# Patient Record
Sex: Male | Born: 1950 | Race: White | Hispanic: No | Marital: Single | Smoking: Former smoker
Health system: Southern US, Community
[De-identification: ages and names within clinical notes are randomized; demographics above are authoritative.]

## PROBLEM LIST (undated history)

## (undated) ENCOUNTER — Emergency Department (HOSPITAL_COMMUNITY): Discharge status: Absent without leave | Payer: Self-pay

## (undated) DIAGNOSIS — S065X9A Traumatic subdural hemorrhage with loss of consciousness of unspecified duration, initial encounter: Secondary | ICD-10-CM

## (undated) DIAGNOSIS — I10 Essential (primary) hypertension: Secondary | ICD-10-CM

## (undated) DIAGNOSIS — I639 Cerebral infarction, unspecified: Secondary | ICD-10-CM

## (undated) DIAGNOSIS — M1990 Unspecified osteoarthritis, unspecified site: Secondary | ICD-10-CM

## (undated) DIAGNOSIS — M069 Rheumatoid arthritis, unspecified: Secondary | ICD-10-CM

## (undated) DIAGNOSIS — G40909 Epilepsy, unspecified, not intractable, without status epilepticus: Secondary | ICD-10-CM

## (undated) DIAGNOSIS — S065XAA Traumatic subdural hemorrhage with loss of consciousness status unknown, initial encounter: Secondary | ICD-10-CM

## (undated) DIAGNOSIS — I6200 Nontraumatic subdural hemorrhage, unspecified: Secondary | ICD-10-CM

## (undated) HISTORY — PX: SUBDURAL HEMATOMA EVACUATION VIA CRANIOTOMY: SUR319

## (undated) HISTORY — DX: Nontraumatic subdural hemorrhage, unspecified: I62.00

## (undated) HISTORY — PX: CEREBRAL ANEURYSM REPAIR: SHX164

## (undated) HISTORY — PX: APPENDECTOMY: SHX54

---

## 1998-02-21 ENCOUNTER — Encounter: Admission: RE | Admit: 1998-02-21 | Discharge: 1998-02-21 | Payer: Self-pay | Admitting: Family Medicine

## 1998-03-22 ENCOUNTER — Encounter: Admission: RE | Admit: 1998-03-22 | Discharge: 1998-03-22 | Payer: Self-pay | Admitting: Family Medicine

## 1998-05-15 ENCOUNTER — Encounter: Admission: RE | Admit: 1998-05-15 | Discharge: 1998-05-15 | Payer: Self-pay | Admitting: Sports Medicine

## 1998-07-05 ENCOUNTER — Encounter: Admission: RE | Admit: 1998-07-05 | Discharge: 1998-07-05 | Payer: Self-pay | Admitting: Sports Medicine

## 1998-07-26 ENCOUNTER — Encounter: Admission: RE | Admit: 1998-07-26 | Discharge: 1998-07-26 | Payer: Self-pay | Admitting: Family Medicine

## 1999-01-14 ENCOUNTER — Encounter: Admission: RE | Admit: 1999-01-14 | Discharge: 1999-01-14 | Payer: Self-pay | Admitting: Family Medicine

## 1999-02-28 ENCOUNTER — Encounter: Admission: RE | Admit: 1999-02-28 | Discharge: 1999-02-28 | Payer: Self-pay | Admitting: Sports Medicine

## 1999-06-06 ENCOUNTER — Encounter: Admission: RE | Admit: 1999-06-06 | Discharge: 1999-06-06 | Payer: Self-pay | Admitting: Sports Medicine

## 2000-01-30 ENCOUNTER — Encounter: Admission: RE | Admit: 2000-01-30 | Discharge: 2000-01-30 | Payer: Self-pay | Admitting: Family Medicine

## 2000-02-13 ENCOUNTER — Encounter: Admission: RE | Admit: 2000-02-13 | Discharge: 2000-02-13 | Payer: Self-pay | Admitting: Sports Medicine

## 2000-02-26 ENCOUNTER — Encounter: Admission: RE | Admit: 2000-02-26 | Discharge: 2000-02-26 | Payer: Self-pay | Admitting: Sports Medicine

## 2000-06-03 ENCOUNTER — Encounter: Admission: RE | Admit: 2000-06-03 | Discharge: 2000-06-03 | Payer: Self-pay | Admitting: Family Medicine

## 2000-08-20 ENCOUNTER — Encounter: Admission: RE | Admit: 2000-08-20 | Discharge: 2000-08-20 | Payer: Self-pay | Admitting: Family Medicine

## 2000-12-31 ENCOUNTER — Encounter: Admission: RE | Admit: 2000-12-31 | Discharge: 2000-12-31 | Payer: Self-pay | Admitting: Sports Medicine

## 2001-04-01 ENCOUNTER — Encounter: Admission: RE | Admit: 2001-04-01 | Discharge: 2001-04-01 | Payer: Self-pay | Admitting: Sports Medicine

## 2001-12-23 ENCOUNTER — Encounter: Payer: Self-pay | Admitting: Otolaryngology

## 2001-12-23 ENCOUNTER — Encounter: Admission: RE | Admit: 2001-12-23 | Discharge: 2001-12-23 | Payer: Self-pay | Admitting: Otolaryngology

## 2002-01-26 ENCOUNTER — Ambulatory Visit (HOSPITAL_COMMUNITY): Admission: RE | Admit: 2002-01-26 | Discharge: 2002-01-26 | Payer: Self-pay | Admitting: Family Medicine

## 2002-01-26 ENCOUNTER — Encounter: Admission: RE | Admit: 2002-01-26 | Discharge: 2002-01-26 | Payer: Self-pay | Admitting: Family Medicine

## 2002-04-28 ENCOUNTER — Encounter: Admission: RE | Admit: 2002-04-28 | Discharge: 2002-04-28 | Payer: Self-pay | Admitting: Sports Medicine

## 2002-11-03 ENCOUNTER — Encounter: Admission: RE | Admit: 2002-11-03 | Discharge: 2002-11-03 | Payer: Self-pay | Admitting: Sports Medicine

## 2003-03-16 ENCOUNTER — Encounter: Admission: RE | Admit: 2003-03-16 | Discharge: 2003-03-16 | Payer: Self-pay | Admitting: Sports Medicine

## 2003-07-04 ENCOUNTER — Encounter: Admission: RE | Admit: 2003-07-04 | Discharge: 2003-08-02 | Payer: Self-pay | Admitting: Neurosurgery

## 2003-08-24 ENCOUNTER — Encounter: Admission: RE | Admit: 2003-08-24 | Discharge: 2003-08-24 | Payer: Self-pay | Admitting: Sports Medicine

## 2003-11-10 ENCOUNTER — Ambulatory Visit (HOSPITAL_COMMUNITY): Admission: RE | Admit: 2003-11-10 | Discharge: 2003-11-10 | Payer: Self-pay | Admitting: Gastroenterology

## 2004-01-25 ENCOUNTER — Ambulatory Visit: Payer: Self-pay

## 2004-03-14 ENCOUNTER — Ambulatory Visit: Payer: Self-pay | Admitting: Sports Medicine

## 2004-03-28 ENCOUNTER — Ambulatory Visit: Payer: Self-pay | Admitting: Sports Medicine

## 2004-05-30 ENCOUNTER — Ambulatory Visit: Payer: Self-pay | Admitting: Sports Medicine

## 2004-07-25 ENCOUNTER — Ambulatory Visit: Payer: Self-pay | Admitting: Sports Medicine

## 2004-08-29 ENCOUNTER — Ambulatory Visit: Payer: Self-pay | Admitting: Sports Medicine

## 2004-11-07 ENCOUNTER — Ambulatory Visit: Payer: Self-pay | Admitting: Sports Medicine

## 2005-03-06 ENCOUNTER — Ambulatory Visit: Payer: Self-pay | Admitting: Sports Medicine

## 2005-05-07 ENCOUNTER — Encounter
Admission: RE | Admit: 2005-05-07 | Discharge: 2005-08-05 | Payer: Self-pay | Admitting: Physical Medicine & Rehabilitation

## 2005-05-07 ENCOUNTER — Ambulatory Visit: Payer: Self-pay | Admitting: Physical Medicine & Rehabilitation

## 2005-06-12 ENCOUNTER — Ambulatory Visit: Payer: Self-pay | Admitting: Sports Medicine

## 2005-06-17 ENCOUNTER — Ambulatory Visit: Payer: Self-pay | Admitting: Sports Medicine

## 2005-10-09 ENCOUNTER — Ambulatory Visit: Payer: Self-pay | Admitting: Sports Medicine

## 2005-12-04 ENCOUNTER — Ambulatory Visit (HOSPITAL_COMMUNITY): Admission: RE | Admit: 2005-12-04 | Discharge: 2005-12-04 | Payer: Self-pay | Admitting: Family Medicine

## 2005-12-04 ENCOUNTER — Ambulatory Visit: Payer: Self-pay | Admitting: Sports Medicine

## 2005-12-17 ENCOUNTER — Ambulatory Visit: Payer: Self-pay | Admitting: Family Medicine

## 2006-02-12 ENCOUNTER — Ambulatory Visit: Payer: Self-pay | Admitting: Sports Medicine

## 2006-03-12 ENCOUNTER — Ambulatory Visit: Payer: Self-pay | Admitting: Family Medicine

## 2006-07-23 DIAGNOSIS — M959 Acquired deformity of musculoskeletal system, unspecified: Secondary | ICD-10-CM | POA: Insufficient documentation

## 2006-07-23 DIAGNOSIS — N3941 Urge incontinence: Secondary | ICD-10-CM

## 2006-07-23 DIAGNOSIS — M069 Rheumatoid arthritis, unspecified: Secondary | ICD-10-CM | POA: Insufficient documentation

## 2006-07-23 DIAGNOSIS — R569 Unspecified convulsions: Secondary | ICD-10-CM

## 2006-07-23 DIAGNOSIS — IMO0001 Reserved for inherently not codable concepts without codable children: Secondary | ICD-10-CM

## 2006-07-23 DIAGNOSIS — I10 Essential (primary) hypertension: Secondary | ICD-10-CM

## 2006-08-06 ENCOUNTER — Ambulatory Visit: Payer: Self-pay | Admitting: Sports Medicine

## 2006-08-06 DIAGNOSIS — E785 Hyperlipidemia, unspecified: Secondary | ICD-10-CM

## 2006-08-07 ENCOUNTER — Encounter: Payer: Self-pay | Admitting: *Deleted

## 2006-08-13 ENCOUNTER — Encounter: Payer: Self-pay | Admitting: Sports Medicine

## 2006-08-26 ENCOUNTER — Encounter: Payer: Self-pay | Admitting: Sports Medicine

## 2006-12-10 ENCOUNTER — Encounter: Payer: Self-pay | Admitting: Sports Medicine

## 2007-01-13 ENCOUNTER — Encounter: Payer: Self-pay | Admitting: Sports Medicine

## 2007-01-21 ENCOUNTER — Encounter: Payer: Self-pay | Admitting: Sports Medicine

## 2007-02-15 ENCOUNTER — Encounter: Payer: Self-pay | Admitting: Sports Medicine

## 2007-03-30 ENCOUNTER — Encounter: Payer: Self-pay | Admitting: Sports Medicine

## 2007-04-02 ENCOUNTER — Encounter: Payer: Self-pay | Admitting: Sports Medicine

## 2007-04-08 ENCOUNTER — Encounter: Payer: Self-pay | Admitting: Sports Medicine

## 2007-05-26 ENCOUNTER — Encounter: Payer: Self-pay | Admitting: Sports Medicine

## 2007-06-13 ENCOUNTER — Encounter (INDEPENDENT_AMBULATORY_CARE_PROVIDER_SITE_OTHER): Payer: Self-pay | Admitting: General Surgery

## 2007-06-13 ENCOUNTER — Inpatient Hospital Stay (HOSPITAL_COMMUNITY): Admission: EM | Admit: 2007-06-13 | Discharge: 2007-06-13 | Payer: Self-pay | Admitting: Emergency Medicine

## 2007-06-17 ENCOUNTER — Ambulatory Visit: Payer: Self-pay | Admitting: Sports Medicine

## 2007-06-17 LAB — CONVERTED CEMR LAB
Cholesterol: 215 mg/dL — ABNORMAL HIGH (ref 0–200)
HDL: 41 mg/dL (ref >39)
Triglycerides: 145 mg/dL (ref <150)
VLDL: 29 mg/dL (ref 0–40)

## 2007-06-21 ENCOUNTER — Telehealth: Payer: Self-pay | Admitting: *Deleted

## 2007-08-05 ENCOUNTER — Encounter: Payer: Self-pay | Admitting: Sports Medicine

## 2007-09-30 ENCOUNTER — Encounter: Payer: Self-pay | Admitting: Sports Medicine

## 2008-01-20 ENCOUNTER — Ambulatory Visit: Payer: Self-pay | Admitting: Sports Medicine

## 2008-03-13 ENCOUNTER — Encounter: Payer: Self-pay | Admitting: *Deleted

## 2008-04-07 ENCOUNTER — Ambulatory Visit: Payer: Self-pay | Admitting: Family Medicine

## 2008-04-07 LAB — CONVERTED CEMR LAB
HDL: 82 mg/dL (ref >39)
Total CHOL/HDL Ratio: 3.1
VLDL: 15 mg/dL (ref 0–40)

## 2008-04-10 ENCOUNTER — Encounter: Payer: Self-pay | Admitting: Family Medicine

## 2008-04-18 ENCOUNTER — Telehealth: Payer: Self-pay | Admitting: Family Medicine

## 2008-05-12 ENCOUNTER — Encounter: Payer: Self-pay | Admitting: Family Medicine

## 2008-07-11 ENCOUNTER — Ambulatory Visit: Payer: Self-pay | Admitting: Family Medicine

## 2008-07-12 ENCOUNTER — Ambulatory Visit (HOSPITAL_COMMUNITY): Admission: RE | Admit: 2008-07-12 | Discharge: 2008-07-12 | Payer: Self-pay | Admitting: Family Medicine

## 2008-07-12 LAB — CONVERTED CEMR LAB
ALT: 64 units/L — ABNORMAL HIGH (ref 0–53)
Alkaline Phosphatase: 45 units/L (ref 39–117)
BUN: 15 mg/dL (ref 6–23)
Calcium: 9.2 mg/dL (ref 8.4–10.5)
Creatinine, Ser: 0.41 mg/dL (ref 0.40–1.50)
Glucose, Bld: 92 mg/dL (ref 70–99)
Potassium: 4.2 meq/L (ref 3.5–5.3)
Total Bilirubin: 0.4 mg/dL (ref 0.3–1.2)
Total Protein: 7.3 g/dL (ref 6.0–8.3)

## 2008-08-21 ENCOUNTER — Emergency Department (HOSPITAL_COMMUNITY): Admission: EM | Admit: 2008-08-21 | Discharge: 2008-08-21 | Payer: Self-pay | Admitting: Emergency Medicine

## 2008-08-24 ENCOUNTER — Ambulatory Visit: Payer: Self-pay | Admitting: Family Medicine

## 2008-08-24 DIAGNOSIS — IMO0002 Reserved for concepts with insufficient information to code with codable children: Secondary | ICD-10-CM

## 2008-08-25 ENCOUNTER — Encounter: Admission: RE | Admit: 2008-08-25 | Discharge: 2008-08-25 | Payer: Self-pay | Admitting: Family Medicine

## 2008-08-25 LAB — CONVERTED CEMR LAB
Alkaline Phosphatase: 52 units/L (ref 39–117)
CO2: 25 meq/L (ref 19–32)
Creatinine, Ser: 0.4 mg/dL (ref 0.40–1.50)
Glucose, Bld: 93 mg/dL (ref 70–99)
Sodium: 141 meq/L (ref 135–145)
Total Bilirubin: 0.5 mg/dL (ref 0.3–1.2)
Vit D, 25-Hydroxy: 6 ng/mL — ABNORMAL LOW (ref 30–89)

## 2008-09-01 ENCOUNTER — Encounter: Payer: Self-pay | Admitting: Family Medicine

## 2008-09-01 ENCOUNTER — Encounter: Admission: RE | Admit: 2008-09-01 | Discharge: 2008-10-05 | Payer: Self-pay | Admitting: Family Medicine

## 2008-10-31 ENCOUNTER — Ambulatory Visit: Payer: Self-pay | Admitting: Family Medicine

## 2008-11-02 ENCOUNTER — Encounter: Payer: Self-pay | Admitting: Family Medicine

## 2008-11-07 ENCOUNTER — Encounter: Payer: Self-pay | Admitting: Family Medicine

## 2008-12-06 ENCOUNTER — Encounter: Payer: Self-pay | Admitting: Family Medicine

## 2008-12-07 ENCOUNTER — Encounter: Payer: Self-pay | Admitting: Family Medicine

## 2008-12-13 ENCOUNTER — Encounter: Payer: Self-pay | Admitting: Family Medicine

## 2009-01-17 ENCOUNTER — Encounter: Payer: Self-pay | Admitting: Family Medicine

## 2009-01-19 ENCOUNTER — Ambulatory Visit: Payer: Self-pay | Admitting: Family Medicine

## 2009-01-25 ENCOUNTER — Telehealth: Payer: Self-pay | Admitting: Family Medicine

## 2009-01-25 ENCOUNTER — Encounter: Payer: Self-pay | Admitting: Family Medicine

## 2009-03-21 ENCOUNTER — Encounter: Admission: RE | Admit: 2009-03-21 | Discharge: 2009-03-21 | Payer: Self-pay | Admitting: Neurosurgery

## 2009-04-04 ENCOUNTER — Encounter: Payer: Self-pay | Admitting: Family Medicine

## 2009-06-29 ENCOUNTER — Encounter: Admission: RE | Admit: 2009-06-29 | Discharge: 2009-06-29 | Payer: Self-pay | Admitting: Neurosurgery

## 2009-08-30 ENCOUNTER — Encounter: Payer: Self-pay | Admitting: Family Medicine

## 2009-10-10 ENCOUNTER — Encounter: Payer: Self-pay | Admitting: Family Medicine

## 2009-10-23 ENCOUNTER — Encounter: Payer: Self-pay | Admitting: Family Medicine

## 2009-11-09 ENCOUNTER — Encounter: Payer: Self-pay | Admitting: Family Medicine

## 2009-12-12 ENCOUNTER — Telehealth: Payer: Self-pay | Admitting: Family Medicine

## 2009-12-14 ENCOUNTER — Ambulatory Visit: Payer: Self-pay | Admitting: Family Medicine

## 2009-12-14 DIAGNOSIS — M3320 Polymyositis, organ involvement unspecified: Secondary | ICD-10-CM

## 2009-12-17 ENCOUNTER — Encounter: Payer: Self-pay | Admitting: Family Medicine

## 2009-12-18 ENCOUNTER — Encounter: Admission: RE | Admit: 2009-12-18 | Discharge: 2009-12-18 | Payer: Self-pay | Admitting: Rheumatology

## 2009-12-20 ENCOUNTER — Telehealth (INDEPENDENT_AMBULATORY_CARE_PROVIDER_SITE_OTHER): Payer: Self-pay | Admitting: *Deleted

## 2009-12-20 ENCOUNTER — Encounter: Payer: Self-pay | Admitting: Family Medicine

## 2010-02-06 ENCOUNTER — Encounter: Payer: Self-pay | Admitting: Family Medicine

## 2010-03-28 ENCOUNTER — Encounter: Payer: Self-pay | Admitting: Family Medicine

## 2010-05-29 ENCOUNTER — Ambulatory Visit: Admit: 2010-05-29 | Payer: Self-pay

## 2010-06-05 ENCOUNTER — Encounter: Payer: Self-pay | Admitting: Family Medicine

## 2010-06-16 ENCOUNTER — Encounter: Payer: Self-pay | Admitting: Rheumatology

## 2010-06-27 NOTE — Consult Note (Signed)
Summary: [facility]   Imported By: Clydell Hakim 12/27/2009 11:17:44  _____________________________________________________________________  External Attachment:    Type:   Image     Comment:   External Document

## 2010-06-27 NOTE — Miscellaneous (Signed)
  Clinical Lists Changes  Problems: Removed problem of OTHER SPECIFIED DISORDERS OF LOWER LEG JOINT (ICD-719.86) Removed problem of SPECIAL SCREENING MALIGNANT NEOPLASM OF PROSTATE (ICD-V76.44) Removed problem of ECZEMA, ATOPIC DERMATITIS (ICD-691.8) Removed problem of URINARY INCONTINENCE (ICD-788.30) Removed problem of ALLERGIC RHINITIS (ICD-477.9) Removed problem of RHEUMATOID ARTHRITIS (ICD-714.0) Removed problem of CEREBROVASCULAR ACCIDENT, HX OF (ICD-V12.50) Removed problem of SEIZURE DISORDER (ICD-780.39) Removed problem of RHINITIS, ALLERGIC (ICD-477.9) Removed problem of Status post  LAPS SURG APPENDEC ([id number]) Removed problem of HYPERTENSION (ICD-401.9) Removed problem of LOSS OF WEIGHT (ICD-783.21) Removed problem of OTHER OSTEOPOROSIS (ICD-733.09) Removed problem of INJURY OTHER&UNSPECIFIED KNEE LEG ANKLE&FOOT (ICD-959.7)

## 2010-06-27 NOTE — Consult Note (Signed)
Summary: Dr. Kellie Simmering  Dr. Kellie Simmering   Imported By: Bradly Bienenstock 11/17/2009 13:29:39  _____________________________________________________________________  External Attachment:    Type:   Image     Comment:   External Document

## 2010-06-27 NOTE — Consult Note (Signed)
Summary: [facility]   Imported By: De Nurse 10/12/2009 14:41:07  _____________________________________________________________________  External Attachment:    Type:   Image     Comment:   External Document

## 2010-06-27 NOTE — Progress Notes (Signed)
Summary: Ref Req  Phone Note Call from Patient Call back at Home Phone [phone]   Caller: Patient Summary of Call: Pt says that Dr. Kellie Simmering wants him to have some tests on his legs, but pt not sure what he needs and is asking that we call Dr. Ines Bloomer office and find out what he needs.  Pt says that if we ask for Crystal she knows what he needs. Initial call taken by: Clydell Hakim,  December 12, 2009 2:43 PM  Follow-up for Phone Call        told him per [facility]'s notes, he may get a muscle bx. he states he was told to call us. told him it has been almost a yr & will need to be seen by md here. appt fri with pcp at 1:30 Follow-up by: Golden Circle RN,  December 12, 2009 4:52 PM    I will discuss with him at next visit.  Paula Compton MD  December 12, 2009 5:24 PM

## 2010-06-27 NOTE — Consult Note (Signed)
Summary: [facility]  R[facility]umatology   Imported By: Clydell Hakim 09/06/2009 09:02:26  _____________________________________________________________________  External Attachment:    Type:   Image     Comment:   External Document

## 2010-06-27 NOTE — Miscellaneous (Signed)
Summary: needs orders  Clinical Lists Changes spoke with [facility] from Dr. Ines Bloomer office [phone]).  she is asking that the pcp make a refrral for Social work & home care aid. he cannot even get up from a chair due to his extreme muscle weakness. the office bought him a lift seat.  he lives with his elderly mom. phone has been turned off. has no transportation. they are concerned about his nutritional status & supports. they have already spoken to him about [facility]. asks that pcp initiate getting him help. to pcp...Marland KitchenMarland KitchenGolden Circle RN  December 20, 2009 10:45 AM   Orders: Added new Referral order of Social Work Referral (Social ) - Signed    Home Health eval ordered at last office visit, after we called Dr. Ines Bloomer office.  Will order social work consult as well.  Paula Compton MD  December 20, 2009 11:49 AM   spoke with DDS and was told that since patient has a home health consult, if nurse  deems necessary she will make the Social Worker referral.  called [facility] and was told that they have a Child psychotherapist thru their agency and  Child psychotherapist will be scheduled to see patient also.  Patient is to be evaluated by home health today . RN will call [facility] office tomorrow to make sure all this gets done [phone] [place] office.) Theresia Lo RN  December 20, 2009 1:41 PM   Noted.  Thanks for the updated information.  Paula Compton MD  December 20, 2009 3:11 PM

## 2010-06-27 NOTE — Progress Notes (Signed)
Summary: phn msg  Phone Note From Other Clinic Call back at [phone]   Caller: [facility]- [facility] Summary of Call: calling to put in a referral request for PT & OT - has fallen twice w/in last 2 weeks - very weak not really eating - wants to request for an appetite stimulant wants to verify meds is taking: Methatrexate 2.5mg  - 5 tabs Sat & Sun only Oxybuten 5mg  daily Phenobarb 60.8mg  1 bedtime takes occasional tylenol  fax to [phone] Initial call taken by: De Nurse,  December 20, 2009 3:07 PM    PT/OT referrals done.  Please call back with med list (updated last visit).   Paula Compton MD  December 20, 2009 3:13 PM Referrals and med list faxed to [phone] Starleen Blue RN 12/21/2009

## 2010-06-27 NOTE — Letter (Signed)
Summary: Generic Letter  [facility]  [address]   [place], [place] [address]   Phone: [phone]  Fax: [phone]    12/17/2009  Chaim Hehir [address] [place], [place]  [address]  Dear Mr. Wurster,   It was a pleasure to see you last week.  I write with the results of your LDL ("bad") cholesterol, which is mildly high at 109 (our goal is less than 100; ideally less than 70).  As we discussed, your cholesterol medicine is being held while your muscle problem is being worked up.  We may wish to consider other treatments for the cholesterol.  Please call with any questions.        Sincerely,   Paula Compton MD  Appended Document: Generic Letter mailed

## 2010-06-27 NOTE — Assessment & Plan Note (Signed)
Summary: wants tests on legs/[place]   Vital Signs:  Patient profile:   60 year old male Height:      67 inches Weight:      116 pounds BMI:     18.23 Temp:     100.1 degrees F oral Pulse rate:   148 / minute BP sitting:   132 / 94  (left arm) Cuff size:   regular  Vitals Entered By: Loralee Pacas CMA (December 14, 2009 1:39 PM) CC: f/u arthritis in leg Is Patient Diabetic? No   CC:  f/u arthritis in leg.  History of Present Illness: [name] [name] comes in today accompanied by his nephew.  He was seen in mid-June by Dr Kellie Simmering, given prednisone burst which he says did not help his symptoms of weakness.  Has fallen twice at home since then.  Dr Kellie Simmering is ordering an MRI lower exts to work up his weakness, also considering muscle biopsy.  Has been seen by Dr Anne Hahn of neurology, EMG performed.   Diagnosis of polymyositis is the working diagnosis.  Has been told to stop his statin med, but he has not done so.  Ran out of phenobarbital and plans to pick up today.     Habits & Providers  Alcohol-Tobacco-Diet     Tobacco Status: never  Current Medications (verified): 1)  Ditropan Xl 5 Mg Tb24 (Oxybutynin Chloride) .... Take 2 Tablets By Mouth Once A Day 2)  Hydrochlorothiazide 25 Mg Tabs (Hydrochlorothiazide) .Marland Kitchen.. 1 By Mouth Once Daily 3)  Methotrexate 2.5 Mg Tabs (Methotrexate Sodium) .... 5 By Mouth On Saturday and Sunday 4)  Phenobarbital 64.8 Mg Tabs (Phenobarbital) .Marland Kitchen.. 1 By Mouth Qhs 5)  Hydroxychloroquine Sulfate 200 Mg Tabs (Hydroxychloroquine Sulfate) .... 2 By Mouth Qam 6)  Orthotic Shoes .... Sig: Patient For Custom Orthotic Shoes Indication: S/p Cva With Balance Problems; L Foot Bunion Surgery. 7)  Vitamin D 1000 Unit Tabs (Cholecalciferol) .... Sig: Take 1 Tab By Mouth Once Daily 8)  Voltaren 1 % Gel (Diclofenac Sodium) .... Sig: Apply To Affected Area (Knees) Topically, Once or Twice Daily Disp 100gm Tube  Allergies (verified): No Known Drug Allergies  Social History: Smoking  Status:  never  Physical Exam  General:  Generally well; no apparent distress. Does not sit down for fear he will not be able to get out of chair. Msk:  standing in the exam room. Wasting of legs, L worse than R.    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 He is seeing both Dr. Kellie Simmering (Rheum) and Dr. Anne Hahn (Neuro) for this.  He has stopped the simvastatin, out of concern that this may be aggravating his symptoms.  He has not noticed much change since doing this.  He is to go for MRI on July 26th, at [facility].  There is some discussion wiht Dr. Kellie Simmering regarding possibly having a muscule biopsy.   To continue on MTX and Plaquenil as per Dr Kellie Simmering.  He is off prednisone now, as recent trial of prednisone did not seem to help with strength.  MRI Bilat thighs. Home health referral for home health eval.  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  To check direct LDL today, as  his last one was awhile ago (and was <100).    Orders: Home Health Referral (Home Health) [facility]- [facility]  3 [phone])  Complete Medication List: 1)  Ditropan Xl 5 Mg Tb24 (Oxybutynin chloride) .... Take 2 tablets by mouth once a day 2)  Hydrochlorothiazide 25 Mg Tabs (Hydrochlorothiazide) .Marland Kitchen.. 1 by mouth once daily 3)  Methotrexate 2.5 Mg Tabs (Methotrexate sodium) .... 5 by mouth on saturday and sunday 4)  Phenobarbital 64.8 Mg Tabs (Phenobarbital) .Marland Kitchen.. 1 by mouth qhs 5)  Hydroxychloroquine Sulfate 200 Mg Tabs (Hydroxychloroquine sulfate) .... 2 by mouth qam 6)  Orthotic Shoes  .... Sig: patient for custom orthotic shoes indication: s/p cva with balance problems; l foot bunion surgery. 7)  Vitamin D 1000 Unit Tabs  (Cholecalciferol) .... Sig: take 1 tab by mouth once daily 8)  Voltaren 1 % Gel (Diclofenac sodium) .... Sig: apply to affected area (knees) topically, once or twice daily disp 100gm tube  Other Orders: Direct LDL-[facility] ([id number])  Patient Instructions: 1)  It was good to see you today.  The MRI ordered by Dr. Kellie Simmering for the 26th of July is of the lower legs, to evaluate for your weakness. 2)  I am ordering a home health evaluation to see what we can do to help you at home.  3)  I am checking your LDL ("bad") cholesterol today as well.  Stay off the cholesterol medication (simvastatin) for now.  4)  I would like to see how things are going in another 2 months.

## 2010-06-27 NOTE — Consult Note (Signed)
Summary: Rheumatology  Rheumatology   Imported By: Clydell Hakim 02/14/2010 14:56:04  _____________________________________________________________________  External Attachment:    Type:   Image     Comment:   External Document

## 2010-06-27 NOTE — Consult Note (Signed)
Summary: [name] [name]- Rheumatology  [name] [name]- Rheumatology   Imported By: De Nurse 06/10/2010 15:49:32  _____________________________________________________________________  External Attachment:    Type:   Image     Comment:   External Document

## 2010-07-16 ENCOUNTER — Ambulatory Visit: Payer: Self-pay | Admitting: Family Medicine

## 2010-07-20 ENCOUNTER — Other Ambulatory Visit: Payer: Self-pay | Admitting: Family Medicine

## 2010-07-21 NOTE — Telephone Encounter (Signed)
Refill request

## 2010-07-30 ENCOUNTER — Ambulatory Visit (INDEPENDENT_AMBULATORY_CARE_PROVIDER_SITE_OTHER): Payer: Medicare Other | Admitting: Family Medicine

## 2010-07-30 ENCOUNTER — Encounter: Payer: Self-pay | Admitting: Family Medicine

## 2010-07-30 DIAGNOSIS — M3320 Polymyositis, organ involvement unspecified: Secondary | ICD-10-CM

## 2010-07-30 DIAGNOSIS — E785 Hyperlipidemia, unspecified: Secondary | ICD-10-CM

## 2010-07-30 DIAGNOSIS — L259 Unspecified contact dermatitis, unspecified cause: Secondary | ICD-10-CM

## 2010-07-30 DIAGNOSIS — M069 Rheumatoid arthritis, unspecified: Secondary | ICD-10-CM

## 2010-07-30 DIAGNOSIS — L309 Dermatitis, unspecified: Secondary | ICD-10-CM

## 2010-07-30 LAB — CONVERTED CEMR LAB
ALT: 30 units/L (ref 0–53)
Albumin: 4.4 g/dL (ref 3.5–5.2)
Alkaline Phosphatase: 59 units/L (ref 39–117)
Glucose, Bld: 86 mg/dL (ref 70–99)
MCV: 91.8 fL (ref 78.0–100.0)
Platelets: 230 10*3/uL (ref 150–400)
RDW: 14.7 % (ref 11.5–15.5)
Total Protein: 7.1 g/dL (ref 6.0–8.3)
WBC: 4.4 10*3/uL (ref 4.0–10.5)

## 2010-07-30 LAB — COMPREHENSIVE METABOLIC PANEL
Alkaline Phosphatase: 59 U/L (ref 39–117)
Glucose, Bld: 86 mg/dL (ref 70–99)
Potassium: 4.2 mEq/L (ref 3.5–5.3)
Sodium: 137 mEq/L (ref 135–145)
Total Protein: 7.1 g/dL (ref 6.0–8.3)

## 2010-07-30 LAB — CBC
HCT: 46.1 % (ref 39.0–52.0)
Hemoglobin: 15.5 g/dL (ref 13.0–17.0)
MCH: 30.9 pg (ref 26.0–34.0)
RBC: 5.02 MIL/uL (ref 4.22–5.81)
RDW: 14.7 % (ref 11.5–15.5)
WBC: 4.4 10*3/uL (ref 4.0–10.5)

## 2010-07-30 MED ORDER — HYDROCORTISONE 2.5 % EX CREA: TOPICAL_CREAM | CUTANEOUS | Status: AC

## 2010-07-30 MED ORDER — OXYBUTYNIN CHLORIDE ER 5 MG PO TB24: 5.0000 mg | ORAL_TABLET | Freq: Every day | ORAL | Status: DC

## 2010-07-30 NOTE — Progress Notes (Signed)
  Subjective:    Patient ID: [name] [name], male    DOB: 1950/11/16, 60 y.o.   MRN: [id number]  HPI [name] [name] is here today with his nephew.  He reports that he has had no more falls. Had seen Dr Kellie Simmering when he had a RA flare, had been restarted on his HCQ and is doing better.  Ran out of oxybutynin and is having more trouble controlling bladder.   Reviewed meds.  Has appt with new neurologist for his seizure disorder, controlled on phenobarbital.    Has had a rash on his back, which is getting better.  Also on his arms (mostly antecubital areas) which has been bothering him. Believes onset associated with use of [employer] brand baby oil.  Has never had with other moisturizing products.    Still needs help to get up from sitting position.  Is walking with 4-pronged walker.  Home health services which we had arranged came for a month and then signed off.   Review of Systems Denies dyspnea, chest pain or falls.     Objective:   Physical Exam  Constitutional:       Standing during entire visit.  No apparent distress.   HENT:       Dry mucus membranes, clear oropharynx  Neck: Normal range of motion. Neck supple.  Cardiovascular: Normal rate and regular rhythm.   Pulmonary/Chest: Effort normal and breath sounds normal. No respiratory distress. He has no wheezes. He has no rales.  Skin:       Mild erythema over bilat antecubital areas, with some excoriation.   Over mid-back, has desquamation with hyperpigmented skin, no erythema.           Assessment & Plan:

## 2010-07-30 NOTE — Assessment & Plan Note (Addendum)
Dermatitis across antecubitals, entire back.  Appears to be exfoliating on back; arms still with mild erythema, appears consistent with atopic derm (he reports possible association with using [employer] brand baby lotion).  To avoid possible trigger; mild potency HC cream for 7-10 days.  Also consider possible effects of HCQ). Checking CPK and CMet today. Will forward results and note to Dr Kellie Simmering once they are back.

## 2010-07-30 NOTE — Patient Instructions (Signed)
It was a pleasure to see you today.  I am sending your labs for the CPK requested by Dr Kellie Simmering, as well as liver and kidney functions, and a Complete Blood Count.   I am glad you are going to see the eye doctor (Dr. Dione Booze) next month.   I sent a prescription for the bladder medicine and a steroid cream for your arms and back.  Please do not use the cream for more than 10 days in a row.  I would like to see you again in another 6 months.

## 2010-07-30 NOTE — Assessment & Plan Note (Signed)
Previously on statin and well controlled; has trended up since statin discontinued, which was done in response to his myositis.  Will recheck d-LDL today.

## 2010-07-30 NOTE — Assessment & Plan Note (Signed)
Continues on MTX and HCQ prescribed by Dr Kellie Simmering. Has eye examwith Dr Dione Booze in April 2012.

## 2010-07-30 NOTE — Assessment & Plan Note (Addendum)
Pt last seen by Dr Kellie Simmering on Jun 05, 2010.  For CPK today, to forward result to Dr Kellie Simmering.

## 2010-07-31 LAB — LDL CHOLESTEROL, DIRECT: Direct LDL: 151 mg/dL — ABNORMAL HIGH

## 2010-08-01 ENCOUNTER — Encounter: Payer: Self-pay | Admitting: Family Medicine

## 2010-10-08 NOTE — H&P (Signed)
NAME:  [name], [name]                   ACCOUNT NO.:  [id number]   MEDICAL RECORD NO.:  [id number]          PATIENT TYPE:  INP   LOCATION:  5120                         FACILITY:  [facility]   PHYSICIAN:  Gabrielle Dare. Janee Morn, M.D.DATE OF BIRTH:  26-May-1951   DATE OF ADMISSION:  06/12/2007  DATE OF DISCHARGE:                              HISTORY & PHYSICAL   CHIEF COMPLAINT:  Right lower quadrant abdominal pain.   HISTORY OF PRESENT ILLNESS:  [name] is a 60 year old white gentleman  who developed lower abdominal pain Saturday morning, persisted through  the day Saturday, and gradually localized towards his right lower  quadrant.  He came to the [facility] emergency department for further  evaluation.  Workup here includes white blood cell count of 18,800.  CT  scan of the abdomen and pelvis was obtained which shows acute  appendicitis without evidence of abscess.  We are asked to evaluate for  further treatment.  He continues to have pain in the right lower  quadrant.  He denies nausea.   PAST MEDICAL HISTORY:  Rheumatoid arthritis and seizure disorder,  cerebral aneurysm and traumatic brain injury.   PAST SURGICAL HISTORY:  Craniotomy x2.  The first one was for aneurysm  and the next one was for a blood clot after a fall.  The second one was  done by Dr. Trey Sailors.   SOCIAL HISTORY:  He is disabled.  He does not smoke.   ALLERGIES:  No known drug allergies.   MEDICATIONS:  1. Phenobarbital 64.8 mg p.o. q.h.s.  2. Methotrexate 7.5 mg p.o. every Saturday and every Sunday.   REVIEW OF SYSTEMS:  Significant for the GI system as above.  MUSCULOSKELETAL:  Chronic rheumatoid arthritis.  CARDIAC:  Negative.  PULMONARY:  Negative.  GU:  Negative.  NEURO:  Chronic seizure disorder  and history as above without current complaints.   PHYSICAL EXAMINATION:  Temperature 98.0, pulse 114, respirations 20,  blood pressure 134/68.  GENERAL:  He is awake and alert.  HEENT:  He has very dry skin on  his face.  He has craniotomy scars x2.  NECK:  Supple with no tenderness or masses.  CHEST:  Lungs are clear to auscultation with good respiratory excursion.  No wheezing is heard.  HEART:  Regular and slightly tachycardiac.  No murmurs heard.  Impulse  is palpable in the left chest.  ABDOMEN:  Has right lower quadrant tenderness with voluntary guarding.  Bowel sounds are present.  There is no significant distention.  No  masses or other tenderness is noted.  SKIN:  Dry with no rashes present.  NEUROLOGIC EXAM:  He is alert and oriented.  He moves all extremities to  command, has no gross focal deficits.   LABORATORY STUDIES:  White blood cell count 18.8, hemoglobin 16.4,  platelets 311.  Basic metabolic panel within normal limits except  glucose 147.  CT scan of the abdomen and pelvis shows acute appendicitis  without evidence of perforation or abscess.   IMPRESSION:  Acute appendicitis.   PLAN:  Will take him to the  operating room emergently for a laparoscopic  appendectomy.  Will give him intravenous antibiotics.  Procedure risks  and benefits were discussed in detail with the patient and he is  agreeable.      Gabrielle Dare Janee Morn, M.D.  Electronically Signed     BET/MEDQ  D:  06/13/2007  T:  06/13/2007  Job:  [id number]

## 2010-10-08 NOTE — Discharge Summary (Signed)
NAME:  [name] [name], [name] [name]                   ACCOUNT NO.:  [id number]   MEDICAL RECORD NO.:  [id number]          PATIENT TYPE:  INP   LOCATION:  5120                         FACILITY:  [facility]   PHYSICIAN:  Lennie Muckle, MD      DATE OF BIRTH:  1951-01-26   DATE OF ADMISSION:  06/12/2007  DATE OF DISCHARGE:  06/13/2007                               DISCHARGE SUMMARY   FINAL DIAGNOSIS:  Acute appendicitis.   HOSPITAL COURSE:  [name] [name] is a 60 year old male who came into the  [facility] Emergency Department due to appendicitis.  He was taken to  the operating room, where a laparoscopic appendectomy was performed by  Dr. Violeta Gelinas.  Postoperatively, he had an uneventful course.  He  has been advanced to a regular diet.  Pain has been controlled with oral  agents.   He is instructed to take over-the-counter stool softener in addition to  his Percocet.   Discharged home.   He will call if he develops any issues and is instructed to perform no  heavy lifting greater than 20 pounds.   He will follow up with Dr. Janee Morn in 2-3 weeks.      Lennie Muckle, MD  Electronically Signed     ALA/MEDQ  D:  06/13/2007  T:  06/13/2007  Job:  [id number]

## 2010-10-08 NOTE — Op Note (Signed)
NAME:  [name], [name]                   ACCOUNT NO.:  [id number]   MEDICAL RECORD NO.:  [id number]          PATIENT TYPE:  INP   LOCATION:  5120                         FACILITY:  [facility]   PHYSICIAN:  Gabrielle Dare. Janee Morn, M.D.DATE OF BIRTH:  03/01/1951   DATE OF PROCEDURE:  06/13/2007  DATE OF DISCHARGE:                               OPERATIVE REPORT   PREOPERATIVE DIAGNOSIS:  Acute appendicitis.   POSTOPERATIVE DIAGNOSIS:  Acute appendicitis.   PROCEDURE:  Laparoscopic appendectomy.   SURGEON:  Gabrielle Dare. Janee Morn, M.D.   ANESTHESIA:  General.   HISTORY OF PRESENT ILLNESS:  [name] is a 60 year old gentleman who  came to [facility] emergency department complaining of lower abdominal  pain localizing towards the right lower quadrant.  Workup demonstrated  leukocytosis of 18,800, and CT scan of the abdomen and pelvis were  consistent with acute appendicitis.  He is brought emergently to the  operating room for laparoscopic appendectomy.   PROCEDURE IN DETAIL:  Informed consent was obtained.  The patient  received intravenous antibiotics.  He was identified in the preop  holding area.  He was brought to the operating room.  General  endotracheal anesthesia was administered by the anesthesia staff.  His  abdomen was prepped and draped in a sterile fashion.  The infraumbilical  region was infiltrated with 0.25% Marcaine with epinephrine.   An infraumbilical incision was made.  The subcutaneous tissues were  dissected down, revealing the anterior fascia.  This was divided  sharply.  The peritoneal cavity was entered under direct vision without  difficulty.  A 0 Vicryl pursestring suture was placed around the fascial  opening, and the Hassan trocar was inserted into the abdomen.  The  abdomen was insufflated with carbon dioxide in standard fashion under  direct vision.  A 12-mm left lower quadrant and a 5-mm right upper  quadrant port were placed.  0.25% Marcaine with epinephrine was used  at  all port sites.  Laparoscopic exploration revealed an inflamed but not  perforated appendix.  The appendix was retracted superiorly.  The base  was dissected down.  The mesoappendix was then divided with the Harmonic  scalpel, achieving excellent hemostasis.  The base of the appendix was  then divided with endoscopic GIA stapler with vascular load.  The  appendix was placed in an EndoCatch bag and removed from the abdomen via  the left lower quadrant port site.  The abdomen was copiously irrigated  with 2 liters of saline.  The mesoappendix and staple line were  inspected.  There was no bleeding present.  Further irrigation was done,  and the irrigation fluid was evacuated.  The staple line and  mesoappendix were rechecked and remained completely dry and intact.  The  residual irrigation fluid was evacuated.  The ports were removed under  direct vision.  Pneumoperitoneum was released.  The [facility] trocar was  removed from the abdomen, and the infraumbilical fascia was closed by  tying the 0 Vicryl pursestring suture, with care not to trap any intra-  abdominal contents.  All three wounds were copiously  irrigated.  The  left lower quadrant and infraumbilical skin was closed with running 4-0  Vicryl subcuticular stitch.  Dermabond was placed on all three wounds.  Sponge, needle, and instrument counts were correct.   The patient tolerated the procedure well without apparent complication  and was taken to the recovery in stable condition.      Gabrielle Dare Janee Morn, M.D.  Electronically Signed     BET/MEDQ  D:  06/13/2007  T:  06/13/2007  Job:  [id number]

## 2010-10-11 NOTE — Op Note (Signed)
NAME:  [name] [name], [name] [name]                             ACCOUNT NO.:  [id number]   MEDICAL RECORD NO.:  [id number]                   PATIENT TYPE:  AMB   LOCATION:  ENDO                                 FACILITY:  [facility]   PHYSICIAN:  Anselmo Rod, M.D.               DATE OF BIRTH:  09/30/1950   DATE OF PROCEDURE:  11/10/2003  DATE OF DISCHARGE:                                 OPERATIVE REPORT   PROCEDURE PERFORMED:  Screening colonoscopy.   ENDOSCOPIST:  Charna Elizabeth, M.D.   INSTRUMENT USED:  Olympus video colonoscope.   INDICATIONS FOR PROCEDURE:  The patient is a 60 year old white male with a  family history of colon cancer and a personal history of occasional bright  red blood per rectum, undergoing screening colonoscopy to rule out colonic  polyps, masses, etc.   PREPROCEDURE PREPARATION:  Informed consent was procured from the patient.  The patient was fasted for eight hours prior to the procedure and prepped  with a bottle of magnesium citrate and a gallon of GoLYTELY the night prior  to the procedure.   PREPROCEDURE PHYSICAL:  The patient had stable vital signs.  Neck supple.  Chest clear to auscultation.  S1 and S2 regular.  Abdomen soft with normal  bowel sounds.   DESCRIPTION OF PROCEDURE:  The patient was placed in left lateral decubitus  position and sedated with 40 mg of Demerol and 6 mg of Versed intravenously.  Once the patient was adequately sedated and maintained on low flow oxygen  and continuous cardiac monitoring, the Olympus video colonoscope was  advanced from the rectum to the cecum.  The appendicular orifice and  ileocecal valve were clearly visualized and photographed.  No masses,  polyps, erosions, ulcerations or diverticula were seen.  Small internal  hemorrhoids were appreciated on retroflexion in the rectum.  The patient  tolerated the procedure well without immediate complications.   IMPRESSION:  1. Normal colonoscopy up to the cecum except for small  internal hemorrhoids.  2. No masses, polyps or diverticula were seen.   RECOMMENDATIONS:  1. Continue high fiber diet with liberal fluid intake.  2. Outpatient followup as need arises in the future.  3. Repeat colorectal cancer screening is recommended in the next five years     unless the patient develops any abnormal symptoms in the interim.                                               Anselmo Rod, M.D.    JNM/MEDQ  D:  11/10/2003  T:  11/11/2003  Job:  [id number]   cc:   Sibyl Parr. Darrick Penna, M.D.  Fax: [phone]

## 2010-12-02 ENCOUNTER — Other Ambulatory Visit: Payer: Self-pay | Admitting: Family Medicine

## 2010-12-02 NOTE — Telephone Encounter (Signed)
Refill request

## 2011-01-31 ENCOUNTER — Ambulatory Visit: Payer: Medicare Other | Admitting: Family Medicine

## 2011-02-14 LAB — DIFFERENTIAL
Basophils Absolute: 0.1
Basophils Relative: 0
Eosinophils Absolute: 0
Lymphs Abs: 0.8
Monocytes Absolute: 0.7
Monocytes Relative: 4
Neutro Abs: 17.3 — ABNORMAL HIGH

## 2011-02-14 LAB — COMPREHENSIVE METABOLIC PANEL
Alkaline Phosphatase: 52
BUN: 6
CO2: 27
Calcium: 9.2
Glucose, Bld: 129 — ABNORMAL HIGH
Sodium: 134 — ABNORMAL LOW
Total Bilirubin: 0.5

## 2011-02-14 LAB — CBC
HCT: 48.6
WBC: 18.8 — ABNORMAL HIGH

## 2011-02-14 LAB — URINALYSIS, ROUTINE W REFLEX MICROSCOPIC
Bilirubin Urine: NEGATIVE
Glucose, UA: NEGATIVE
Hgb urine dipstick: NEGATIVE
Protein, ur: NEGATIVE
Specific Gravity, Urine: 1.017

## 2011-02-14 LAB — I-STAT 8, (EC8 V) (CONVERTED LAB)
Chloride: 100
Glucose, Bld: 147 — ABNORMAL HIGH
HCT: 54 — ABNORMAL HIGH
Hemoglobin: 18.4 — ABNORMAL HIGH
Sodium: 133 — ABNORMAL LOW

## 2011-02-14 LAB — POCT I-STAT CREATININE: Operator id: 294521

## 2011-02-14 LAB — OCCULT BLOOD X 1 CARD TO LAB, STOOL: Fecal Occult Bld: NEGATIVE

## 2011-03-06 ENCOUNTER — Telehealth: Payer: Self-pay | Admitting: *Deleted

## 2011-03-06 NOTE — Telephone Encounter (Signed)
[name] [name] calling to let Dr. Mauricio Po know she went out and did a Routine Annual Assessment on [name] [name]. She is very concerned with his weight loss.  At the assessment he weighed 123 lbs with a BMI of 20.47. According to their questionnaire, [name] [name] is considered malnourished.  His B/P was 120/88 in right arm and 120/80 in left arm.  Patient is alert with no acute distress.  [name] [name] has an appointment with Dr. Mauricio Po on Friday 03/07/11 @ 9 am.  [name] [name] just wanted to make Dr. Mauricio Po aware of their concerned with his weight loss before his appointment on Friday.  Ileana Ladd

## 2011-03-07 ENCOUNTER — Ambulatory Visit: Payer: Medicare Other | Admitting: Family Medicine

## 2011-03-07 NOTE — Telephone Encounter (Signed)
Patient is coming in to visit today.  Will assess. [name] [name] O

## 2011-07-25 DIAGNOSIS — I6200 Nontraumatic subdural hemorrhage, unspecified: Secondary | ICD-10-CM

## 2011-07-25 HISTORY — DX: Nontraumatic subdural hemorrhage, unspecified: I62.00

## 2011-08-06 ENCOUNTER — Emergency Department (HOSPITAL_COMMUNITY): Payer: Medicare Other

## 2011-08-06 ENCOUNTER — Inpatient Hospital Stay (HOSPITAL_COMMUNITY)
Admission: EM | Admit: 2011-08-06 | Discharge: 2011-08-11 | DRG: 086 | Disposition: A | Discharge status: Skilled Nursing Facility | Payer: Medicare Other | Attending: Family Medicine | Admitting: Family Medicine

## 2011-08-06 ENCOUNTER — Other Ambulatory Visit: Payer: Self-pay

## 2011-08-06 ENCOUNTER — Encounter (HOSPITAL_COMMUNITY): Payer: Self-pay | Admitting: Emergency Medicine

## 2011-08-06 DIAGNOSIS — Y998 Other external cause status: Secondary | ICD-10-CM

## 2011-08-06 DIAGNOSIS — R000 Tachycardia, unspecified: Secondary | ICD-10-CM | POA: Diagnosis present

## 2011-08-06 DIAGNOSIS — R5383 Other fatigue: Secondary | ICD-10-CM | POA: Diagnosis present

## 2011-08-06 DIAGNOSIS — R5381 Other malaise: Secondary | ICD-10-CM | POA: Diagnosis present

## 2011-08-06 DIAGNOSIS — E559 Vitamin D deficiency, unspecified: Secondary | ICD-10-CM | POA: Diagnosis present

## 2011-08-06 DIAGNOSIS — R269 Unspecified abnormalities of gait and mobility: Secondary | ICD-10-CM

## 2011-08-06 DIAGNOSIS — M959 Acquired deformity of musculoskeletal system, unspecified: Secondary | ICD-10-CM | POA: Diagnosis present

## 2011-08-06 DIAGNOSIS — S065X9A Traumatic subdural hemorrhage with loss of consciousness of unspecified duration, initial encounter: Secondary | ICD-10-CM

## 2011-08-06 DIAGNOSIS — I10 Essential (primary) hypertension: Secondary | ICD-10-CM | POA: Diagnosis present

## 2011-08-06 DIAGNOSIS — I69998 Other sequelae following unspecified cerebrovascular disease: Secondary | ICD-10-CM

## 2011-08-06 DIAGNOSIS — S065X0A Traumatic subdural hemorrhage without loss of consciousness, initial encounter: Principal | ICD-10-CM | POA: Diagnosis present

## 2011-08-06 DIAGNOSIS — K5900 Constipation, unspecified: Secondary | ICD-10-CM | POA: Diagnosis present

## 2011-08-06 DIAGNOSIS — M069 Rheumatoid arthritis, unspecified: Secondary | ICD-10-CM | POA: Diagnosis present

## 2011-08-06 DIAGNOSIS — I6990 Unspecified sequelae of unspecified cerebrovascular disease: Secondary | ICD-10-CM

## 2011-08-06 DIAGNOSIS — E785 Hyperlipidemia, unspecified: Secondary | ICD-10-CM | POA: Diagnosis present

## 2011-08-06 DIAGNOSIS — G40909 Epilepsy, unspecified, not intractable, without status epilepticus: Secondary | ICD-10-CM | POA: Diagnosis present

## 2011-08-06 DIAGNOSIS — W19XXXA Unspecified fall, initial encounter: Secondary | ICD-10-CM | POA: Diagnosis present

## 2011-08-06 DIAGNOSIS — E46 Unspecified protein-calorie malnutrition: Secondary | ICD-10-CM | POA: Diagnosis present

## 2011-08-06 DIAGNOSIS — M3320 Polymyositis, organ involvement unspecified: Secondary | ICD-10-CM | POA: Diagnosis present

## 2011-08-06 DIAGNOSIS — Z87891 Personal history of nicotine dependence: Secondary | ICD-10-CM

## 2011-08-06 DIAGNOSIS — Z681 Body mass index (BMI) 19 or less, adult: Secondary | ICD-10-CM

## 2011-08-06 DIAGNOSIS — IMO0001 Reserved for inherently not codable concepts without codable children: Secondary | ICD-10-CM | POA: Diagnosis present

## 2011-08-06 DIAGNOSIS — I498 Other specified cardiac arrhythmias: Secondary | ICD-10-CM | POA: Diagnosis present

## 2011-08-06 HISTORY — DX: Epilepsy, unspecified, not intractable, without status epilepticus: G40.909

## 2011-08-06 HISTORY — DX: Cerebral infarction, unspecified: I63.9

## 2011-08-06 HISTORY — DX: Rheumatoid arthritis, unspecified: M06.9

## 2011-08-06 HISTORY — DX: Traumatic subdural hemorrhage with loss of consciousness status unknown, initial encounter: S06.5XAA

## 2011-08-06 HISTORY — DX: Essential (primary) hypertension: I10

## 2011-08-06 HISTORY — DX: Unspecified osteoarthritis, unspecified site: M19.90

## 2011-08-06 HISTORY — DX: Traumatic subdural hemorrhage with loss of consciousness of unspecified duration, initial encounter: S06.5X9A

## 2011-08-06 LAB — CARDIAC PANEL(CRET KIN+CKTOT+MB+TROPI)
CK, MB: 15.5 ng/mL (ref 0.3–4.0)
Total CK: 341 U/L — ABNORMAL HIGH (ref 7–232)

## 2011-08-06 LAB — COMPREHENSIVE METABOLIC PANEL
Albumin: 4 g/dL (ref 3.5–5.2)
Alkaline Phosphatase: 98 U/L (ref 39–117)
BUN: 7 mg/dL (ref 6–23)
CO2: 32 mEq/L (ref 19–32)
Chloride: 101 mEq/L (ref 96–112)
GFR calc non Af Amer: >90 mL/min (ref >90)
Glucose, Bld: 93 mg/dL (ref 70–99)
Potassium: 4.8 mEq/L (ref 3.5–5.1)
Total Bilirubin: 0.5 mg/dL (ref 0.3–1.2)

## 2011-08-06 LAB — DIFFERENTIAL
Basophils Relative: 1 % (ref 0–1)
Lymphocytes Relative: 13 % (ref 12–46)
Lymphs Abs: 1 10*3/uL (ref 0.7–4.0)
Monocytes Relative: 9 % (ref 3–12)
Neutro Abs: 5.8 10*3/uL (ref 1.7–7.7)
Neutrophils Relative %: 78 % — ABNORMAL HIGH (ref 43–77)

## 2011-08-06 LAB — POCT I-STAT TROPONIN I: Troponin i, poc: 0.43 ng/mL (ref 0.00–0.08)

## 2011-08-06 LAB — URINALYSIS, ROUTINE W REFLEX MICROSCOPIC
Glucose, UA: NEGATIVE mg/dL
Leukocytes, UA: NEGATIVE
Nitrite: NEGATIVE
Specific Gravity, Urine: 1.021 (ref 1.005–1.030)
pH: 6.5 (ref 5.0–8.0)

## 2011-08-06 LAB — CBC
HCT: 44.3 % (ref 39.0–52.0)
Hemoglobin: 15.4 g/dL (ref 13.0–17.0)
RBC: 4.62 MIL/uL (ref 4.22–5.81)

## 2011-08-06 LAB — PHENOBARBITAL LEVEL: Phenobarbital: 11.4 ug/mL — ABNORMAL LOW (ref 15.0–40.0)

## 2011-08-06 LAB — URINE MICROSCOPIC-ADD ON

## 2011-08-06 MED ORDER — SODIUM CHLORIDE 0.9 % IV BOLUS (SEPSIS)
500.0000 mL | Freq: Once | INTRAVENOUS | Status: AC
  Administered 2011-08-06: 1000 mL via INTRAVENOUS

## 2011-08-06 NOTE — ED Notes (Signed)
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

## 2011-08-06 NOTE — Consult Note (Signed)
I have reviewed the CT scan on [name] [name]. This fluid collection  Is causing no mass effect. The basal cisterns are widely patent. This is not an operative mass at this time. Unless his physical exam changes I will see the patient on 3/14. Contact me sooner if he does experience a change.

## 2011-08-06 NOTE — ED Notes (Signed)
Pt presents in w/c, " my knees are locke up" unable to stand to transfer to bed. Pt total care assisting into bed. Pt states he fell twice today, has small abrasions on back of head, top of head and left knee, denies injury other than can not walk. Nephew states he could walk yesterday but unable to today. Pt has history of arthritis in his joints. Pt also smells of cat urine and clothes have urine and stool stains.

## 2011-08-06 NOTE — ED Provider Notes (Signed)
History     CSN: [id number]  Arrival date & time 08/06/11  1416   First MD Initiated Contact with Patient 08/06/11 1816      Chief Complaint  Patient presents with  . Weakness    (Consider location/radiation/quality/duration/timing/severity/associated sxs/prior treatment) The history is provided by the patient.   patient states she's had generalized weakness for last couple years. He states it is worse over last few days. He states he has trouble walking now because of it. No chest pain. No nausea vomiting diarrhea. No fevers. He states his legs came out and he fell forward. 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 No dysuria. No cough.  Past Medical History  Diagnosis Date  . Hypertension   . Arthritis   . Stroke     No past surgical history on file.  No family history on file.  History  Substance Use Topics  . Smoking status: Former Games developer  . Smokeless tobacco: Not on file  . Alcohol Use: Not on file      Review of Systems  Constitutional: Negative for activity change and appetite change.  HENT: Negative for neck stiffness.   Eyes: Negative for pain.  Respiratory: Negative for chest tightness and shortness of breath.   Cardiovascular: Negative for chest pain and leg swelling.  Gastrointestinal: Negative for nausea, vomiting, abdominal pain and diarrhea.  Genitourinary: Negative for flank pain.  Musculoskeletal: Negative for back pain.  Skin: Negative for rash.  Neurological: Positive for weakness. Negative for numbness and headaches.  Psychiatric/Behavioral: Negative for behavioral problems.    Allergies  Review of patient's allergies indicates no known allergies.  Home Medications   Current Outpatient Rx  Name Route Sig Dispense Refill  . B COMPLEX VITAMINS PO CAPS Oral Take 1 capsule by mouth daily.    Marland Kitchen BISMUTH SUBSALICYLATE 262 MG PO CHEW Oral Chew 524 mg by mouth  as needed. For upset stomach    . CELECOXIB 200 MG PO CAPS Oral Take 200 mg by mouth 2 (two) times daily.    Marland Kitchen VITAMIN D 1000 UNITS PO TABS Oral Take 1,000 Units by mouth daily.    Marland Kitchen HYDROXYCHLOROQUINE SULFATE 200 MG PO TABS  Take 2 tablets every AM     . METHOTREXATE 2.5 MG PO TABS Oral Take 5 mg by mouth See admin instructions. Take 5 mg by mouth on Monday and Tuesday    . MOMETASONE FUROATE 50 MCG/ACT NA SUSP Nasal Place 2 sprays into the nose daily.    Marland Kitchen OVER THE COUNTER MEDICATION Oral Take 1 capsule by mouth daily. Mangosteen for arthritis pain    . PHENOBARBITAL 64.8 MG PO TABS Oral Take 64.8 mg by mouth at bedtime.      Marland Kitchen SIMVASTATIN 20 MG PO TABS Oral Take 20 mg by mouth at bedtime.    Marland Kitchen UNABLE TO FIND  Orthotic Shoes  CVA with balance problems, Left foot bunion     . DICLOFENAC SODIUM 1 % TD GEL  Apply to knees once or twice daily     . OXYBUTYNIN CHLORIDE ER 5 MG PO TB24 Oral Take 5 mg by mouth daily.        BP 120/95  Pulse 101  Temp(Src) 98.4 F (36.9 C) (Oral)  Resp 16  SpO2 98%  Physical Exam  Nursing note and vitals reviewed. Constitutional: He is oriented to person, place, and time. He appears well-developed and  well-nourished.  HENT:  Head: Normocephalic and atraumatic.  Eyes: EOM are normal. Pupils are equal, round, and reactive to light.  Neck: Normal range of motion. Neck supple.  Cardiovascular: Normal rate, regular rhythm and normal heart sounds.   No murmur heard. Pulmonary/Chest: Effort normal and breath sounds normal.  Abdominal: Soft. Bowel sounds are normal. He exhibits no distension and no mass. There is no tenderness. There is no rebound and no guarding.  Musculoskeletal: Normal range of motion. He exhibits no edema.  Neurological: He is alert and oriented to person, place, and time. No cranial nerve deficit.       Attempted to get patient to straighten his legs with legs bent at 90. He is not able to straighten either leg at the knee.  Skin: Skin is  warm and dry.  Psychiatric: He has a normal mood and affect.    ED Course  Procedures (including critical care time)  Labs Reviewed  URINALYSIS, ROUTINE W REFLEX MICROSCOPIC - Abnormal; Notable for the following:    Ketones, ur 40 (*)    Protein, ur 30 (*)    All other components within normal limits  DIFFERENTIAL - Abnormal; Notable for the following:    Neutrophils Relative 78 (*)    All other components within normal limits  COMPREHENSIVE METABOLIC PANEL - Abnormal; Notable for the following:    Creatinine, Ser 0.21 (*)    All other components within normal limits  POCT I-STAT TROPONIN I - Abnormal; Notable for the following:    Troponin i, poc 0.43 (*)    All other components within normal limits  CARDIAC PANEL(CRET KIN+CKTOT+MB+TROPI) - Abnormal; Notable for the following:    Total CK 341 (*)    CK, MB 15.5 (*)    Relative Index 4.5 (*)    All other components within normal limits  PHENOBARBITAL LEVEL - Abnormal; Notable for the following:    Phenobarbital 11.4 (*)    All other components within normal limits  CBC  URINE MICROSCOPIC-ADD ON   Dg Chest 2 View  08/06/2011  *RADIOLOGY REPORT*  Clinical Data: Weakness  CHEST - 2 VIEW  Comparison: None.  Findings: Degenerative changes of bilateral shoulders. Lungs clear. Heart size and pulmonary vascularity normal.  No effusion.  IMPRESSION: No acute disease  Original Report Authenticated By: Osa Craver, M.D.   Ct Head Wo Contrast  08/06/2011  *RADIOLOGY REPORT*  Clinical Data: Bilateral lower extremity weakness, fell, blunt head trauma  CT HEAD WITHOUT CONTRAST  Technique:  Contiguous axial images were obtained from the base of the skull through the vertex without contrast.  Comparison: 03/21/2009  Findings: There is a new small acute left temporal and parietal subdural hematoma, measuring up to 5 mm thickness.  This results in minimal mass effect.  There is no midline shift.  Changes of previous left and right  craniotomies without fracture or other acute calvarial abnormality.  Stable right frontal encephalomalacia.  No new parenchymal abnormality.  Acute infarct may be inapparent on noncontrast CT.  IMPRESSION:  1.  New left temporal and parietal subdural hematoma with minimal mass effect. 2.  Stable postoperative changes and right frontal encephalomalacia.  I telephoned the critical test results to Dr. Rubin Payor at the time of interpretation.  Original Report Authenticated By: Osa Craver, M.D.   Dg Knee Complete 4 Views Left  08/06/2011  *RADIOLOGY REPORT*  Clinical Data: Pain and weakness without trauma  LEFT KNEE - COMPLETE 4+ VIEW  Comparison: 02/20/2011  Findings: No  effusion.  Diffuse osteopenia.  Negative for fracture, dislocation, or other acute bone injury. Normal alignment.  IMPRESSION:  1.  Osteopenia without fracture or other acute abnormality.  Original Report Authenticated By: Osa Craver, M.D.     1. Fall   2. Subdural hematoma   3. Elevated troponin     Date: 08/06/2011  Rate: 95  Rhythm: normal sinus rhythm  QRS Axis: normal  Intervals: normal  ST/T Wave abnormalities: normal  Conduction Disutrbances:none  Narrative Interpretation:   Old EKG Reviewed: none available     MDM   patient has had generalized weakness for the last 2 years, worse the last 2 weeks. He had a fall earlier today because his legs give out. This is reportedly not that unusual for him. He said previous stroke and has some chronic left-sided weakness. He states that his left knee buckled. On exam his week equally on both lower extremities. There is no evidence of trauma on his head. He's had no chest pain. Head CT was done and showed a subdural hematoma. It is approximately 5 mm and is not causing any mass effect at this time. He has had previous bleeds and is postsurgical from them. He's had no chest pain. His initial troponin was elevated, but the repeat was normal. He has an elevated  total CK which go along with his myositis. He does have a positive index though. Patient's primary care doctors with family [facility]. He'll be transferred to step down at [facility] for admission. They requested a consult to neurosurgery and cardiology. I discussed with Dr. Herbie Baltimore from [facility] heart and vascular, he requested a 2-D echo tomorrow to look for wall motion abnormalities. He states the patient cannot be anticoagulated this time due to the bleed. I also discussed the case with Dr. Franky Macho.we'll see the patient in followup.   Juliet Rude. Rubin Payor, MD 08/06/11 2157

## 2011-08-07 ENCOUNTER — Encounter (HOSPITAL_COMMUNITY): Payer: Self-pay | Admitting: Pulmonary Disease

## 2011-08-07 ENCOUNTER — Inpatient Hospital Stay (HOSPITAL_COMMUNITY): Payer: Medicare Other

## 2011-08-07 DIAGNOSIS — W19XXXA Unspecified fall, initial encounter: Secondary | ICD-10-CM

## 2011-08-07 DIAGNOSIS — R778 Other specified abnormalities of plasma proteins: Secondary | ICD-10-CM | POA: Diagnosis present

## 2011-08-07 DIAGNOSIS — S065XAA Traumatic subdural hemorrhage with loss of consciousness status unknown, initial encounter: Secondary | ICD-10-CM | POA: Diagnosis present

## 2011-08-07 DIAGNOSIS — R000 Tachycardia, unspecified: Secondary | ICD-10-CM | POA: Diagnosis present

## 2011-08-07 DIAGNOSIS — S065X9A Traumatic subdural hemorrhage with loss of consciousness of unspecified duration, initial encounter: Secondary | ICD-10-CM | POA: Diagnosis present

## 2011-08-07 DIAGNOSIS — G8110 Spastic hemiplegia affecting unspecified side: Secondary | ICD-10-CM

## 2011-08-07 DIAGNOSIS — M069 Rheumatoid arthritis, unspecified: Secondary | ICD-10-CM

## 2011-08-07 LAB — BASIC METABOLIC PANEL
CO2: 28 mEq/L (ref 19–32)
Calcium: 8.3 mg/dL — ABNORMAL LOW (ref 8.4–10.5)
Creatinine, Ser: 0.22 mg/dL — ABNORMAL LOW (ref 0.50–1.35)
GFR calc Af Amer: >90 mL/min (ref >90)
Sodium: 140 mEq/L (ref 135–145)

## 2011-08-07 LAB — GLUCOSE, CAPILLARY
Glucose-Capillary: 60 mg/dL — ABNORMAL LOW (ref 70–99)
Glucose-Capillary: 66 mg/dL — ABNORMAL LOW (ref 70–99)
Glucose-Capillary: 138 mg/dL — ABNORMAL HIGH (ref 70–99)
Glucose-Capillary: 128 mg/dL — ABNORMAL HIGH (ref 70–99)

## 2011-08-07 LAB — CBC
MCH: 32.6 pg (ref 26.0–34.0)
Platelets: 245 10*3/uL (ref 150–400)
RBC: 4.29 MIL/uL (ref 4.22–5.81)
RDW: 13.6 % (ref 11.5–15.5)
WBC: 5.9 10*3/uL (ref 4.0–10.5)

## 2011-08-07 LAB — CARDIAC PANEL(CRET KIN+CKTOT+MB+TROPI)
CK, MB: 10.8 ng/mL (ref 0.3–4.0)
CK, MB: 14.3 ng/mL (ref 0.3–4.0)
Total CK: 636 U/L — ABNORMAL HIGH (ref 7–232)
Troponin I: <0.3 ng/mL (ref <0.30)
Troponin I: <0.3 ng/mL (ref <0.30)
Troponin I: <0.3 ng/mL (ref <0.30)

## 2011-08-07 MED ORDER — FLUTICASONE PROPIONATE 50 MCG/ACT NA SUSP
1.0000 | Freq: Every day | NASAL | Status: DC
  Administered 2011-08-07 – 2011-08-11 (×4): 1 via NASAL
  Filled 2011-08-07: qty 16

## 2011-08-07 MED ORDER — TAMSULOSIN HCL 0.4 MG PO CAPS
0.4000 mg | ORAL_CAPSULE | Freq: Every day | ORAL | Status: DC
  Administered 2011-08-07 – 2011-08-10 (×5): 0.4 mg via ORAL
  Filled 2011-08-07 (×6): qty 1

## 2011-08-07 MED ORDER — VITAMIN D3 25 MCG (1000 UNIT) PO TABS
1000.0000 [IU] | ORAL_TABLET | Freq: Every day | ORAL | Status: DC
  Administered 2011-08-07 – 2011-08-08 (×2): 1000 [IU] via ORAL
  Filled 2011-08-07 (×2): qty 1

## 2011-08-07 MED ORDER — ONDANSETRON HCL 4 MG/2ML IJ SOLN: 4.0000 mg | Freq: Four times a day (QID) | INTRAMUSCULAR | Status: DC | PRN

## 2011-08-07 MED ORDER — PHENOBARBITAL 64.8 MG PO TABS
64.8000 mg | ORAL_TABLET | Freq: Every day | ORAL | Status: DC
  Administered 2011-08-07 – 2011-08-08 (×3): 64.8 mg via ORAL
  Filled 2011-08-07 (×3): qty 2

## 2011-08-07 MED ORDER — PANTOPRAZOLE SODIUM 40 MG IV SOLR
40.0000 mg | Freq: Every day | INTRAVENOUS | Status: DC
Start: 2011-08-07 — End: 2011-08-07
  Filled 2011-08-07: qty 40

## 2011-08-07 MED ORDER — OXYBUTYNIN CHLORIDE ER 5 MG PO TB24: 5.0000 mg | ORAL_TABLET | Freq: Every day | ORAL | Status: DC

## 2011-08-07 MED ORDER — SODIUM CHLORIDE 0.9 % IV BOLUS (SEPSIS)
1000.0000 mL | Freq: Once | INTRAVENOUS | Status: AC
  Administered 2011-08-07: 1000 mL via INTRAVENOUS

## 2011-08-07 MED ORDER — HYDROXYCHLOROQUINE SULFATE 200 MG PO TABS
400.0000 mg | ORAL_TABLET | Freq: Every day | ORAL | Status: DC
  Administered 2011-08-07 – 2011-08-11 (×5): 400 mg via ORAL
  Filled 2011-08-07 (×5): qty 2

## 2011-08-07 MED ORDER — LABETALOL HCL 5 MG/ML IV SOLN
10.0000 mg | INTRAVENOUS | Status: DC | PRN
  Filled 2011-08-07: qty 8

## 2011-08-07 MED ORDER — SODIUM CHLORIDE 0.9 % IV BOLUS (SEPSIS)
500.0000 mL | Freq: Once | INTRAVENOUS | Status: AC
  Administered 2011-08-07: 500 mL via INTRAVENOUS

## 2011-08-07 MED ORDER — TRAMADOL HCL 50 MG PO TABS: 50.0000 mg | ORAL_TABLET | Freq: Four times a day (QID) | ORAL | Status: DC | PRN

## 2011-08-07 MED ORDER — BISMUTH SUBSALICYLATE 262 MG PO CHEW
524.0000 mg | CHEWABLE_TABLET | ORAL | Status: DC | PRN
  Filled 2011-08-07: qty 2

## 2011-08-07 MED ORDER — PANTOPRAZOLE SODIUM 40 MG PO TBEC
40.0000 mg | DELAYED_RELEASE_TABLET | Freq: Every day | ORAL | Status: DC
  Administered 2011-08-07 – 2011-08-11 (×5): 40 mg via ORAL
  Filled 2011-08-07 (×5): qty 1

## 2011-08-07 MED ORDER — METHOTREXATE 2.5 MG PO TABS
5.0000 mg | ORAL_TABLET | ORAL | Status: DC
  Administered 2011-08-11: 5 mg via ORAL
  Filled 2011-08-07: qty 2

## 2011-08-07 MED ORDER — SENNOSIDES-DOCUSATE SODIUM 8.6-50 MG PO TABS
1.0000 | ORAL_TABLET | Freq: Two times a day (BID) | ORAL | Status: DC
  Administered 2011-08-07 – 2011-08-11 (×9): 1 via ORAL
  Filled 2011-08-07 (×9): qty 1

## 2011-08-07 MED ORDER — METOPROLOL TARTRATE 25 MG PO TABS
25.0000 mg | ORAL_TABLET | Freq: Two times a day (BID) | ORAL | Status: DC
  Administered 2011-08-07 – 2011-08-08 (×3): 25 mg via ORAL
  Filled 2011-08-07 (×4): qty 1

## 2011-08-07 MED ORDER — SODIUM CHLORIDE 0.9 % IV SOLN
INTRAVENOUS | Status: DC
  Administered 2011-08-07 – 2011-08-08 (×5): via INTRAVENOUS

## 2011-08-07 MED ORDER — ENSURE CLINICAL ST REVIGOR PO LIQD
237.0000 mL | Freq: Three times a day (TID) | ORAL | Status: DC
  Administered 2011-08-07 – 2011-08-11 (×8): 237 mL via ORAL

## 2011-08-07 MED ORDER — ACETAMINOPHEN 650 MG RE SUPP: 650.0000 mg | RECTAL | Status: DC | PRN

## 2011-08-07 MED ORDER — ACETAMINOPHEN 325 MG PO TABS
650.0000 mg | ORAL_TABLET | ORAL | Status: DC | PRN
  Administered 2011-08-07: 650 mg via ORAL
  Filled 2011-08-07: qty 2

## 2011-08-07 MED ORDER — SIMVASTATIN 20 MG PO TABS
20.0000 mg | ORAL_TABLET | Freq: Every day | ORAL | Status: DC
  Administered 2011-08-07: 20 mg via ORAL
  Filled 2011-08-07 (×2): qty 1

## 2011-08-07 NOTE — Progress Notes (Addendum)
INITIAL ADULT NUTRITION ASSESSMENT Date: 08/07/2011   Time: 10:56 AM  Reason for Assessment: Nutrition Risk: Malnutrition  ASSESSMENT: Male 61 y.o.  Dx: Subdural hematoma  Hx:  Past Medical History  Diagnosis Date  . Hypertension   . Arthritis   . Stroke     Related Meds:     . cholecalciferol  1,000 Units Oral Daily  . fluticasone  1 spray Each Nare Daily  . hydroxychloroquine  400 mg Oral Daily  . methotrexate  5 mg Oral Custom  . pantoprazole (PROTONIX) IV  40 mg Intravenous QHS  . PHENobarbital  64.8 mg Oral QHS  . senna-docusate  1 tablet Oral BID  . simvastatin  20 mg Oral QHS  . sodium chloride  1,000 mL Intravenous Once  . sodium chloride  500 mL Intravenous Once  . sodium chloride  500 mL Intravenous Once  . Tamsulosin HCl  0.4 mg Oral QPC supper  . DISCONTD: oxybutynin  5 mg Oral Daily    Ht: 5\' 6"  (167.6 cm)  Wt: 115 lb 11.9 oz (52.5 kg)  Ideal Wt: 64.5 kg  % Ideal Wt: 81%  Usual Wt: 180 lbs (81.8 kg): from one year ago per patient report.   % Usual Wt: 65%  Body mass index is 18.68 kg/(m^2). WNL  Food/Nutrition Related Hx: Patient has muscle wasting due to diagnosis of polymyositis. Denies any nausea or vomiting  No BMs to document. Has been experiencing some difficulty urinating. Patient has stage I pressure ulcer on mid buttocks.  Patient reported weight loss of 65 lbs began a year ago intentionally by reducing consumption of red meats and soft drinks.  Patient stated appetite has been good PTA, meal tray in patient's room was approximately 75% consumed.  Patient's loss of 36% usual body weight over one year period, and severe loss of body fat and muscle mass on lower and upper extremities meets criteria for severe malnutrition in context of chronic illness.   Labs:  CMP     Component Value Date/Time   NA 140 08/07/2011 0127   K 4.1 08/07/2011 0127   CL 104 08/07/2011 0127   CO2 28 08/07/2011 0127   GLUCOSE 74 08/07/2011 0127   BUN 7  08/07/2011 0127   CREATININE 0.22* 08/07/2011 0127   CREATININE 0.32* 07/30/2010 1118   CALCIUM 8.3* 08/07/2011 0127   PROT 7.5 08/06/2011 1850   ALBUMIN 4.0 08/06/2011 1850   AST 33 08/06/2011 1850   ALT 21 08/06/2011 1850   ALKPHOS 98 08/06/2011 1850   BILITOT 0.5 08/06/2011 1850   GFRNONAA >90 08/07/2011 0127   GFRAA >90 08/07/2011 0127  Some amounts of ketones and protein in urine (3/13)  CBG (last 3)   Basename 08/07/11 1006 08/07/11 0943  GLUCAP 66* 60*  Likely due to previous NPO status   Intake:   Intake/Output Summary (Last 24 hours) at 08/07/11 1100 Last data filed at 08/07/11 1000  Gross per 24 hour  Intake   2230 ml  Output    800 ml  Net   1430 ml     Diet Order: Regular  Supplements/Tube Feeding: N/A  IVF:    sodium chloride Last Rate: 125 mL/hr at 08/07/11 1610    Estimated Nutritional Needs:   Kcal:1600 - 1800 Protein:80 - 90 gm Fluid: > 1.6 L  Discussed importance of consuming extra protein in diet due to his muscle wasting. Patient mentioned MD has recommended he starting to drink Ensures, so was interested in having them  supplemented with meals. He was also interested in learning about high protein foods he could incorporate into his diet. Will provide patient with AND Nutrition Care Manual 'High-Protein Therapy" handout before discharge   NUTRITION DIAGNOSIS: -Inadequate protein intake (NI-5.7.1).  Status: Ongoing  RELATED TO: increased needs with polymyositis dx  AS EVIDENCE BY: moderate muscle wasting in upper and lower extremities, 65 lbs weight loss in one year  MONITORING/EVALUATION(Goals): Goal: Consume >/= 90% of estimated needs Monitor: PO intake, labs, weights, supplement tolerance  EDUCATION NEEDS: -Education needs addressed  INTERVENTION:  Supplement with Ensure Complete TID to provide 1050 kcal, 39 grams protein, and 520 ml free water  Educated on high protein foods  RD to follow nutrition care plan  Dietitian  #:[phone]  DOCUMENTATION CODES Per approved criteria  -Severe malnutrition in the context of chronic illness    Lloyd Huger 08/07/2011, 10:56 AM  Hettie Holstein [phone]

## 2011-08-07 NOTE — H&P (Signed)
FMTS Attending Admission Note  Patient seen and examined by me, I am his primary physician in [facility], last visit with me March, 2012.  Dougles has had muscle wasting and diagnosis of polymyositis, seen by Dr. Kellie Simmering quarterly.  He reports that he was last seen there about 4 months ago and has upcoming appointment.  The nature of the falls yesterday discussed with Michelle Piper, and it sounds as if they are primarly due to weakness and difficulty clearing his feet on threshold.  He usually walks with a cane, and this continues to be the case.   He had home PT and a home health PT/OT eval in the past, although not involved in PT now.  Nickolai does not drive, and depends on a nephew to drive him to appointments.  The nephew recently got a job in [place] and is less available.   Regarding secondary CVA risk reduction, Yordin's statin medication has been intentionally held because it is thought to precipitate worsening of his myositis.  Would request most recent labs from Dr Ines Bloomer office, as patient has labs ordered routinely there and they do not always come to [facility].  I agree with hip x-ray to evaluate for hip pain following fall. Paula Compton, M.D.

## 2011-08-07 NOTE — Progress Notes (Signed)
  Family Medicine Teaching Service Exam NOTE   Patient name: [name] [name] Medical record number: [id number] Date of birth: April 04, 1951 Age: 61 y.o. Gender: male Length of Stay:  LOS: 1 day     Pt re-examined this AM.  He had no changes to his neurologic exam - CN II - XII intact, PERRLA, L UE and LLE with residual weakness from prior stroke however unchanged from prior exam.  Mentating well.  Asking for a diet.              DISPOSITION  Neurosurgery to see today.    Andrena Mews, DO [facility] Resident - PGY-1 08/07/2011 12:29 PM

## 2011-08-07 NOTE — Consult Note (Signed)
Physical Medicine and Rehabilitation Consult Reason for Consult: difficulty walking, falls with left knee pain Referring Phsyician:  Dr. Jennette Kettle    HPI: [name] [name] is an 61 y.o. male with history of aneurysm repair '96. Crani for SDH '99, polymyositis with gait difficulty, admitted 03/13 with multiple falls.  CT head with new small left temporal and parietal SDH and changes from prior left and right crani without fracture. Patient without change in his chronic left HP. X rays evaluated by Dr. Franky Macho and no operative intervention needed.   Patient noted to have tachycardia and SHVC recommended adding low dose BB and d/c of statin as with history of myositis. PT evaluation done today and  Noted to have problems with balance.  MD, PT recommending CIR.  The patient has a left hemiparesis at baseline to 2 prior history of right SDH. He's had minimal ambulation over the years. An MRI of both thighs and 2011 showed severe atrophy of both quadriceps and hamstrings muscle groups.  Review of Systems  Respiratory: Negative for shortness of breath and wheezing.   Cardiovascular: Negative for chest pain and palpitations.  Gastrointestinal: Negative for heartburn and nausea.  Musculoskeletal: Positive for joint pain (Left knee pain for the past year-worsening.) and falls (problems walking for the past year.).  Neurological: Positive for focal weakness (Left sided weakness since aneurysm repair.). Negative for headaches.  All other systems reviewed and are negative.   Past Medical History  Diagnosis Date  . Hypertension   . Arthritis   . Stroke   . SDH (subdural hematoma)   . Seizure disorder   . RA (rheumatoid arthritis)    Past Surgical History  Procedure Date  . Appendectomy   . Cerebral aneurysm repair     with left hemiparesis.  . Subdural hematoma evacuation via craniotomy    History reviewed. No pertinent family history.  Social History:  Lives with 47 year old mother.  Nephew who used to  help out has a job in [place] and less available.  He reports that he has quit smoking. His smoking use included Cigarettes. He quit after 25 years of use. He does not have any smokeless tobacco history on file. His alcohol quit alcohol use 20+ years ago. Does not use drugs.   No Known Allergies  Prior to Admission medications   Medication Sig Start Date End Date Taking? Authorizing Provider  celecoxib (CELEBREX) 200 MG capsule Take 200 mg by mouth 2 (two) times daily.   Yes Historical Provider, MD  Cholecalciferol (VITAMIN D PO) Take 1,200 mg by mouth daily.   Yes Historical Provider, MD  hydroxychloroquine (PLAQUENIL) 200 MG tablet Take 200 mg by mouth 2 (two) times daily.   Yes Historical Provider, MD  methotrexate (RHEUMATREX) 2.5 MG tablet Take 12.5 mg by mouth 2 (two) times a week. Saturday and Sunday. 5 tablets  Caution:Chemotherapy. Protect from light.   Yes Historical Provider, MD  oxybutynin (DITROPAN-XL) 5 MG 24 hr tablet Take 5 mg by mouth daily.   07/30/10  Yes Barbaraann Barthel, MD  PHENobarbital (LUMINAL) 64.8 MG tablet Take 64.8 mg by mouth at bedtime.    Yes Historical Provider, MD   Scheduled Medications:    . cholecalciferol  1,000 Units Oral Daily  . feeding supplement  237 mL Oral TID WC  . fluticasone  1 spray Each Nare Daily  . hydroxychloroquine  400 mg Oral Daily  . methotrexate  5 mg Oral Custom  . metoprolol tartrate  25 mg  Oral BID  . pantoprazole  40 mg Oral Q1200  . PHENobarbital  64.8 mg Oral QHS  . senna-docusate  1 tablet Oral BID  . sodium chloride  1,000 mL Intravenous Once  . sodium chloride  500 mL Intravenous Once  . sodium chloride  500 mL Intravenous Once  . Tamsulosin HCl  0.4 mg Oral QPC supper  . DISCONTD: oxybutynin  5 mg Oral Daily  . DISCONTD: pantoprazole (PROTONIX) IV  40 mg Intravenous QHS  . DISCONTD: simvastatin  20 mg Oral QHS   PRN MED's: acetaminophen, acetaminophen, bismuth subsalicylate, labetalol, ondansetron (ZOFRAN) IV, DISCONTD:  traMADol Home: Home Living Lives With:  (Mother) Receives Help From: Family Type of Home: House Home Layout: One level Home Access: Stairs to enter Entrance Stairs-Rails: Right;Left;Can reach both Secretary/administrator of Steps: 2 Bathroom Shower/Tub: Engineer, manufacturing systems: Standard Home Adaptive Equipment: Bedside commode/3-in-1;Walker - rolling;Straight cane Additional Comments: Theraband for exercies.  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 Patient reports using cane predominately with gait. Functional Status:  Mobility: Bed Mobility Bed Mobility: Yes Rolling Left: 5: Supervision;With rail Rolling Left Details (indicate cue type and reason): Use of momentum to achieve. Left Sidelying to Sit: 4: Min assist;With rails Left Sidelying to Sit Details (indicate cue type and reason): Difficulty initiating trunk Sitting - Scoot to Edge of Bed: 4: Min assist Sitting - Scoot to [place] of Bed Details (indicate cue type and reason): with use of pad -patient with difficulty reciprocally scooting Sit to Supine: 4: Min assist Sit to Supine - Details (indicate cue type and reason): For control of trunk and elevating lower extremities Scooting to HOB: 1: +2 Total assist;Patient percentage (comment) Scooting to [facility] Details (indicate cue type and reason): 30% atetmpts independently with pulling on rail but unable to achieve significant movement Transfers Transfers: Yes Sit to Stand: 2: Max assist;From bed;From chair/3-in-1 Sit to Stand Details (indicate cue type and reason): Only able to achieve approx. 3/4 stand secondary to weakness. Signifcant difficulty with initiation. Stand to Sit: 2: Max  assist;To bed;To chair/3-in-1 Stand to Sit Details: To control descent Stand Pivot Transfers: 2: Max assist Stand Pivot Transfer Details (indicate cue type and reason): Patient achieves sufficient height with max. assistance to clear arm rest only. Significant weakness Ambulation/Gait Ambulation/Gait: No    ADL:    Cognition: Cognition Overall Cognitive Status: Appears within functional limits for tasks assessed Arousal/Alertness: Awake/alert Orientation Level: Oriented X4 Attention: Selective Selective Attention: Appears intact Memory: Impaired Memory Impairment: Retrieval deficit;Decreased short term memory Awareness: Appears intact Problem Solving: Appears intact Safety/Judgment: Appears intact Comments: Questionable judgment given numerous falls Cognition Arousal/Alertness: Awake/alert Overall Cognitive Status: Appears within functional limits for tasks assessed Orientation Level: Oriented X4  Blood pressure 105/63, pulse 116, temperature 98.2 F (36.8 C), temperature source Oral, resp. rate 15, height 5\' 6"  (1.676 m), weight 52.5 kg (115 lb 11.9 oz), SpO2 100.00%. Physical Exam  Nursing note and vitals reviewed. Constitutional: He is oriented to person, place, and time. He appears malnourished.  HENT:  Head: Normocephalic and atraumatic.  Eyes: Pupils are equal, round, and reactive to light.  Neck: Normal range of motion. Neck supple.  Cardiovascular: Regular rhythm.  Tachycardia present.   Pulmonary/Chest: Effort normal and breath sounds normal.  Abdominal: Soft. Bowel sounds are normal.  Musculoskeletal:  Left knee: He exhibits swelling, erythema and bony tenderness.  Neurological: He is alert and oriented to person, place, and time. He displays weakness (left side).  Skin: Abrasion (Left knee) noted.  Psychiatric: Memory, affect and judgment normal.   muscular skeletal: Contracture at bilateral knees 30, contracture bilateral ankle 20, contracture left  shoulder up to 80 abduction Motor strength 3 minus at the left deltoid 4 minus at the bicep tricep and grip. 4 minus in the right deltoid bicep tricep and grip. 1/5-2/5 in bilateral hip flexors knee extensors ankle dorsiflexors and plantar flexors Sensory exam is normal to light touch in bilateral upper and lower extremities Orientation is to person but not to hospital not to month  Results for orders placed during the hospital encounter of 08/06/11 (from the past 24 hour(s))  URINALYSIS, ROUTINE W REFLEX MICROSCOPIC     Status: Abnormal   Collection Time   08/06/11  6:17 PM      Component Value Range   Color, Urine YELLOW  YELLOW    APPearance CLEAR  CLEAR    Specific Gravity, Urine 1.021  1.005 - 1.030    pH 6.5  5.0 - 8.0    Glucose, UA NEGATIVE  NEGATIVE (mg/dL)   Hgb urine dipstick NEGATIVE  NEGATIVE    Bilirubin Urine NEGATIVE  NEGATIVE    Ketones, ur 40 (*) NEGATIVE (mg/dL)   Protein, ur 30 (*) NEGATIVE (mg/dL)   Urobilinogen, UA 1.0  0.0 - 1.0 (mg/dL)   Nitrite NEGATIVE  NEGATIVE    Leukocytes, UA NEGATIVE  NEGATIVE   URINE MICROSCOPIC-ADD ON     Status: Normal   Collection Time   08/06/11  6:17 PM      Component Value Range   Squamous Epithelial / LPF RARE  RARE    WBC, UA 0-2  <3 (WBC/hpf)   RBC / HPF 0-2  <3 (RBC/hpf)   Bacteria, UA RARE  RARE   CBC     Status: Normal   Collection Time   08/06/11  6:50 PM      Component Value Range   WBC 7.5  4.0 - 10.5 (K/uL)   RBC 4.62  4.22 - 5.81 (MIL/uL)   Hemoglobin 15.4  13.0 - 17.0 (g/dL)   HCT 16.1  09.6 - 04.5 (%)   MCV 95.9  78.0 - 100.0 (fL)   MCH 33.3  26.0 - 34.0 (pg)   MCHC 34.8  30.0 - 36.0 (g/dL)   RDW 40.9  81.1 - 91.4 (%)   Platelets 260  150 - 400 (K/uL)  DIFFERENTIAL     Status: Abnormal   Collection Time   08/06/11  6:50 PM      Component Value Range   Neutrophils Relative 78 (*) 43 - 77 (%)   Neutro Abs 5.8  1.7 - 7.7 (K/uL)   Lymphocytes Relative 13  12 - 46 (%)   Lymphs Abs 1.0  0.7 - 4.0 (K/uL)    Monocytes Relative 9  3 - 12 (%)   Monocytes Absolute 0.6  0.1 - 1.0 (K/uL)   Eosinophils Relative 0  0 - 5 (%)   Eosinophils Absolute 0.0  0.0 - 0.7 (K/uL)   Basophils Relative 1  0 - 1 (%)   Basophils Absolute 0.0  0.0 - 0.1 (K/uL)  COMPREHENSIVE METABOLIC PANEL     Status: Abnormal   Collection Time   08/06/11  6:50 PM      Component Value Range   Sodium 142  135 -  145 (mEq/L)   Potassium 4.8  3.5 - 5.1 (mEq/L)   Chloride 101  96 - 112 (mEq/L)   CO2 32  19 - 32 (mEq/L)   Glucose, Bld 93  70 - 99 (mg/dL)   BUN 7  6 - 23 (mg/dL)   Creatinine, Ser 1.61 (*) 0.50 - 1.35 (mg/dL)   Calcium 9.4  8.4 - 09.6 (mg/dL)   Total Protein 7.5  6.0 - 8.3 (g/dL)   Albumin 4.0  3.5 - 5.2 (g/dL)   AST 33  0 - 37 (U/L)   ALT 21  0 - 53 (U/L)   Alkaline Phosphatase 98  39 - 117 (U/L)   Total Bilirubin 0.5  0.3 - 1.2 (mg/dL)   GFR calc non Af Amer >90  >90 (mL/min)   GFR calc Af Amer >90  >90 (mL/min)  CARDIAC PANEL(CRET KIN+CKTOT+MB+TROPI)     Status: Abnormal   Collection Time   08/06/11  6:50 PM      Component Value Range   Total CK 341 (*) 7 - 232 (U/L)   CK, MB 15.5 (*) 0.3 - 4.0 (ng/mL)   Troponin I <0.30  <0.30 (ng/mL)   Relative Index 4.5 (*) 0.0 - 2.5   PHENOBARBITAL LEVEL     Status: Abnormal   Collection Time   08/06/11  6:50 PM      Component Value Range   Phenobarbital 11.4 (*) 15.0 - 40.0 (ug/mL)  POCT I-STAT TROPONIN I     Status: Abnormal   Collection Time   08/06/11  7:01 PM      Component Value Range   Troponin i, poc 0.43 (*) 0.00 - 0.08 (ng/mL)   Comment NOTIFIED PHYSICIAN     Comment 3           MRSA PCR SCREENING     Status: Normal   Collection Time   08/07/11 12:47 AM      Component Value Range   MRSA by PCR NEGATIVE  NEGATIVE   CARDIAC PANEL(CRET KIN+CKTOT+MB+TROPI)     Status: Abnormal   Collection Time   08/07/11  1:27 AM      Component Value Range   Total CK 296 (*) 7 - 232 (U/L)   CK, MB 10.8 (*) 0.3 - 4.0 (ng/mL)   Troponin I <0.30  <0.30 (ng/mL)   Relative  Index 3.6 (*) 0.0 - 2.5   CBC     Status: Normal   Collection Time   08/07/11  1:27 AM      Component Value Range   WBC 5.9  4.0 - 10.5 (K/uL)   RBC 4.29  4.22 - 5.81 (MIL/uL)   Hemoglobin 14.0  13.0 - 17.0 (g/dL)   HCT 04.5  40.9 - 81.1 (%)   MCV 96.5  78.0 - 100.0 (fL)   MCH 32.6  26.0 - 34.0 (pg)   MCHC 33.8  30.0 - 36.0 (g/dL)   RDW 91.4  78.2 - 95.6 (%)   Platelets 245  150 - 400 (K/uL)  BASIC METABOLIC PANEL     Status: Abnormal   Collection Time   08/07/11  1:27 AM      Component Value Range   Sodium 140  135 - 145 (mEq/L)   Potassium 4.1  3.5 - 5.1 (mEq/L)   Chloride 104  96 - 112 (mEq/L)   CO2 28  19 - 32 (mEq/L)   Glucose, Bld 74  70 - 99 (mg/dL)   BUN 7  6 - 23 (mg/dL)  Creatinine, Ser 0.22 (*) 0.50 - 1.35 (mg/dL)   Calcium 8.3 (*) 8.4 - 10.5 (mg/dL)   GFR calc non Af Amer >90  >90 (mL/min)   GFR calc Af Amer >90  >90 (mL/min)  GLUCOSE, CAPILLARY     Status: Abnormal   Collection Time   08/07/11  9:43 AM      Component Value Range   Glucose-Capillary 60 (*) 70 - 99 (mg/dL)   Comment 1 Notify RN     Comment 2 Documented in Chart    GLUCOSE, CAPILLARY     Status: Abnormal   Collection Time   08/07/11 10:06 AM      Component Value Range   Glucose-Capillary 66 (*) 70 - 99 (mg/dL)   Comment 1 Documented in Chart     Comment 2 Notify RN    GLUCOSE, CAPILLARY     Status: Abnormal   Collection Time   08/07/11 11:45 AM      Component Value Range   Glucose-Capillary 138 (*) 70 - 99 (mg/dL)   Comment 1 Notify RN     Comment 2 Documented in Chart    CARDIAC PANEL(CRET KIN+CKTOT+MB+TROPI)     Status: Abnormal   Collection Time   08/07/11 12:31 PM      Component Value Range   Total CK 600 (*) 7 - 232 (U/L)   CK, MB 14.5 (*) 0.3 - 4.0 (ng/mL)   Troponin I <0.30  <0.30 (ng/mL)   Relative Index 2.4  0.0 - 2.5    Dg Chest 2 View  08/06/2011  *RADIOLOGY REPORT*  Clinical Data: Weakness  CHEST - 2 VIEW  Comparison: None.  Findings: Degenerative changes of bilateral  shoulders. Lungs clear. Heart size and pulmonary vascularity normal.  No effusion.  IMPRESSION: No acute disease  Original Report Authenticated By: Osa Craver, M.D.   Dg Hip Complete Right  08/07/2011  *RADIOLOGY REPORT*  Clinical Data: Right hip pain secondary to a fall.  RIGHT HIP - COMPLETE 2+ VIEW  Comparison: None.  Findings: There is no fracture, dislocation, arthritis, or other acute abnormality of the right hip.  Diffuse osteopenia.  IMPRESSION: No acute abnormality.  Original Report Authenticated By: Gwynn Burly, M.D.   Ct Head Wo Contrast  08/06/2011  *RADIOLOGY REPORT*  Clinical Data: Bilateral lower extremity weakness, fell, blunt head trauma  CT HEAD WITHOUT CONTRAST  Technique:  Contiguous axial images were obtained from the base of the skull through the vertex without contrast.  Comparison: 03/21/2009  Findings: There is a new small acute left temporal and parietal subdural hematoma, measuring up to 5 mm thickness.  This results in minimal mass effect.  There is no midline shift.  Changes of previous left and right craniotomies without fracture or other acute calvarial abnormality.  Stable right frontal encephalomalacia.  No new parenchymal abnormality.  Acute infarct may be inapparent on noncontrast CT.  IMPRESSION:  1.  New left temporal and parietal subdural hematoma with minimal mass effect. 2.  Stable postoperative changes and right frontal encephalomalacia.  I telephoned the critical test results to Dr. Rubin Payor at the time of interpretation.  Original Report Authenticated By: Osa Craver, M.D.   Dg Knee Complete 4 Views Left  08/06/2011  *RADIOLOGY REPORT*  Clinical Data: Pain and weakness without trauma  LEFT KNEE - COMPLETE 4+ VIEW  Comparison: 02/20/2011  Findings: No  effusion.  Diffuse osteopenia.  Negative for fracture, dislocation, or other acute bone injury. Normal alignment.  IMPRESSION:  1.  Osteopenia without fracture or other acute abnormality.   Original Report Authenticated By: Osa Craver, M.D.    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 He has bilateral knee and ankle contractures as well as chronic atrophy of both quadricep and hamstring muscle groups. His more recent TBI has contributed to his poor balance. 1. Does the need for close, 24 hr/day medical supervision in concert with the patient's rehab needs make it unreasonable for this patient to be served in a less intensive setting? No 2. Co-Morbidities requiring supervision/potential complications: Rheumatoid arthritis, hyper tension, fibromyalgia 3. Due to bladder management, bowel management, safety, disease management and pain management, does the patient require 24 hr/day rehab nursing? Potentially 4. Does the patient require coordinated care of a physician, rehab nurse, PT (0.5-1 hrs/day, 5 days/week), OT (0.5-1 hrs/day, 5 days/week) and SLP (0.5-1 hrs/day, 5 days/week) to address physical and functional deficits in the context of the above medical diagnosis(es)? Potentially Addressing deficits in the following areas: balance, endurance, locomotion, strength, transferring, bathing, dressing, toileting and cognition 5. Can the patient actively participate in an intensive therapy program of at least 3 hrs of therapy per day at least 5 days per week? Potentially 6. The potential for patient to make measurable gains while on inpatient rehab is poor 7. Anticipated functional outcomes upon discharge from inpatients are min to mod assist mobility at wheelchair level PT, min to mod assist ADLs OT, 100% max C4 basic biographical information as well as orientation SLP 8. Estimated rehab length of stay to reach the above functional goals is: 2 weeks 9. Does the patient have adequate social supports to accommodate these discharge functional goals? Potentially 10. Anticipated D/C setting:  Home 11. Anticipated post D/C treatments: HH therapy 12. Overall Rehab/Functional Prognosis: poor  RECOMMENDATIONS: This patient's condition is appropriate for continued rehabilitative care in the following setting: SNF Patient has agreed to participate in recommended program. Potentially Note that insurance prior authorization may be required for reimbursement for recommended care.  Comment: Has many pre-existing problems including contractures that will limit his potential for improvement.   Jacquelynn Cree 08/07/2011

## 2011-08-07 NOTE — Evaluation (Signed)
Physical Therapy Evaluation Patient Details Name: [name] [name] MRN: [id number] DOB: [birth year] Today's Date: 08/07/2011  Problem List:  Patient Active Problem List  Diagnoses  . HYPERLIPIDEMIA  . HYPERTENSION, BENIGN SYSTEMIC  . POLYMYOSITIS  . RHEUMATOID ARTHRITIS (NOT JUVENILE)  . FIBROMYALGIA, FIBROMYOSITIS  . CEREBROVAS DIS, LATE EFFECTS (S/P CVA)  . CONVULSIONS, SEIZURES, NOS  . INCONTINENCE, URGE  . VERTEBRAL FRACTURE  . Dermatitis    Past Medical History:  Past Medical History  Diagnosis Date  . Hypertension   . Arthritis   . Stroke    Past Surgical History:  Past Surgical History  Procedure Date  . Appendectomy   . Cerebral aneurysm repair     PT Assessment/Plan/Recommendation PT Assessment Clinical Impression Statement: Patient presents with left temporal/parietal subdural hematoma S/P fall and left knee X-ray shows osteopenia. Patient also reporting right chest pain to PT today therfore nurse alerted. Patient presents with significant weakness of the bil. lower extremities - left leg pre-morbid. He was having difficulty ambulating prior to admission and anticipate decline in function from this event. I would highly recommend rehab for this patient to maximixze his independence and decrease the burden of care on his elderly mother and nephew. PT Recommendation/Assessment: Patient will need skilled PT in the acute care venue PT Problem List: Decreased strength;Decreased range of motion;Decreased activity tolerance;Decreased balance;Decreased mobility;Decreased coordination;Decreased knowledge of precautions;Decreased knowledge of use of DME;Pain PT Therapy Diagnosis : Difficulty walking;Generalized weakness;Acute pain PT Plan PT Frequency: Min 4X/week PT Treatment/Interventions: DME instruction;Gait training;Functional mobility training;Therapeutic activities;Therapeutic exercise;Balance training;Patient/family education;Neuromuscular re-education PT  Recommendation Recommendations for Other Services: Rehab consult Follow Up Recommendations: Inpatient Rehab Equipment Recommended: Defer to next venue PT Goals  Acute Rehab PT Goals PT Goal Formulation: With patient Time For Goal Achievement: 2 weeks Pt will go Supine/Side to Sit: with supervision PT Goal: Supine/Side to Sit - Progress: Goal set today Pt will Sit at [facility] of Bed: with supervision;3-5 min;with unilateral upper extremity support PT Goal: Sit at Edge Of Bed - Progress: Goal set today Pt will go Sit to Supine/Side: with supervision PT Goal: Sit to Supine/Side - Progress: Goal set today Pt will go Sit to Stand: with min assist;with upper extremity assist PT Goal: Sit to Stand - Progress: Goal set today Pt will go Stand to Sit: with min assist;with upper extremity assist PT Goal: Stand to Sit - Progress: Goal set today Pt will Transfer Bed to Chair/Chair to Bed: with min assist PT Transfer Goal: Bed to Chair/Chair to Bed - Progress: Goal set today Pt will Stand: with bilateral upper extremity support;with min assist;1 - 2 min PT Goal: Stand - Progress: Goal set today Pt will Ambulate: 16 - 50 feet;with least restrictive assistive device;with mod assist PT Goal: Ambulate - Progress: Goal set today  PT Evaluation Precautions/Restrictions  Precautions Precautions: Fall Prior Functioning  Home Living Lives With:  (Mother and nephew) Dolores Lory Help From: Family Type of Home: House Home Layout: One level Home Access: Stairs to enter Entrance Stairs-Rails: Right;Left;Can reach both Secretary/administrator of Steps: 2 Bathroom Shower/Tub: Engineer, manufacturing systems: Standard Home Adaptive Equipment: Bedside commode/3-in-1;Walker - rolling;Straight cane Additional Comments: Theraband for exercies. Prior Function Level of Independence: Independent with basic ADLs;Needs assistance with homemaking;Needs assistance with tranfers;Requires assistive device for  independence;Independent with gait (Sponge bath only) Comments: Patient states tendency to fall anytime he negotiates a curb. Patient reports using cane predominately with gait. Cognition Cognition Arousal/Alertness: Awake/alert Overall Cognitive Status: Appears within functional limits for tasks  assessed Orientation Level: Oriented X4 Sensation/Coordination Sensation Light Touch: Not tested Additional Comments: Nothing abnormal reported Coordination Gross Motor Movements are Fluid and Coordinated: No Coordination and Movement Description: Decreased co-ordination of bil lower extremities Extremity Assessment RLE Assessment RLE Assessment: Exceptions to [facility] RLE AROM (degrees) Overall AROM Right Lower Extremity: Deficits;Due to decreased strength RLE Strength RLE Overall Strength: Deficits RLE Overall Strength Comments: Hip flexion 3/5, knee extension 2/5, Hip extension 3/5, hip abduction and adduction 3/5, knee flexion 2/5, ankle 3/5 LLE Assessment LLE Assessment: Exceptions to [facility] LLE AROM (degrees) Overall AROM Left Lower Extremity: Deficits;Due to premorbid status LLE Overall AROM Comments: Trace of ankle inversion/eversion but no dorsi/plantarflexion. SLR with lag. Ankle to neutral dorsiflexion. LLE Strength LLE Overall Strength Comments: Grossly 2-3/5 Mobility (including Balance) Bed Mobility Bed Mobility: Yes Rolling Left: 5: Supervision;With rail Rolling Left Details (indicate cue type and reason): Use of momentum to achieve. Left Sidelying to Sit: 4: Min assist;With rails Left Sidelying to Sit Details (indicate cue type and reason): Difficulty initiating trunk Sitting - Scoot to Edge of Bed: 4: Min assist Sitting - Scoot to [employer] of Bed Details (indicate cue type and reason): with use of pad -patient with difficulty reciprocally scooting Sit to Supine: 4: Min assist Sit to Supine - Details (indicate cue type and reason): For control of trunk and elevating lower  extremities Scooting to HOB: 1: +2 Total assist;Patient percentage (comment) Scooting to [facility] Details (indicate cue type and reason): 30% atetmpts independently with pulling on rail but unable to achieve significant movement Transfers Transfers: Yes Sit to Stand: 2: Max assist;From bed;From chair/3-in-1 Sit to Stand Details (indicate cue type and reason): Only able to achieve approx. 3/4 stand secondary to weakness. Signifcant difficulty with initiation. Stand to Sit: 2: Max assist;To bed;To chair/3-in-1 Stand to Sit Details: To control descent Stand Pivot Transfers: 2: Max assist Stand Pivot Transfer Details (indicate cue type and reason): Patient achieves sufficient height with max. assistance to clear arm rest only. Significant weakness Ambulation/Gait Ambulation/Gait: No  Posture/Postural Control Posture/Postural Control: Postural limitations Postural Limitations: trunk weakness Balance Balance Assessed: Yes Static Sitting Balance Static Sitting - Balance Support: Bilateral upper extremity supported Static Sitting - Level of Assistance: 5: Stand by assistance Static Sitting - Comment/# of Minutes: But unable to maintain balance without support of upper extremities End of Session PT - End of Session Equipment Utilized During Treatment: Gait belt Activity Tolerance: Patient tolerated treatment well Patient left: in bed;with call bell in reach Nurse Communication: Mobility status for transfers General Behavior During Session: [facility] for tasks performed Cognition: [facility] for tasks performed  Edwyna Perfect, PT  Pager [phone]  08/07/2011, 10:25 AM

## 2011-08-07 NOTE — Evaluation (Signed)
Speech Language Pathology Evaluation Patient Details Name: [name] [name] MRN: [id number] DOB: Nov 28, 1950 Today's Date: 08/07/2011  Problem List:  Patient Active Problem List  Diagnoses  . 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 HYPERTENSION, BENIGN SYSTEMIC  . POLYMYOSITIS, weak, unsteady, falls  . RHEUMATOID ARTHRITIS (NOT JUVENILE)  . FIBROMYALGIA, FIBROMYOSITIS  . History of CVA  . CONVULSIONS, SEIZURES, NOS  . INCONTINENCE, URGE  . VERTEBRAL FRACTURE  . Dermatitis  . Subdural hematoma  . Troponin level elevated, one of 3 Troponin elevated at 0.4  . Sinus tachycardia   Past Medical History:  Past Medical History  Diagnosis Date  . Hypertension   . Arthritis   . Stroke    Past Surgical History:  Past Surgical History  Procedure Date  . Appendectomy   . Cerebral aneurysm repair     SLP Assessment/Plan/Recommendation Assessment Clinical Impression Statement: 61 y.o. male admitted 08/06/11 after falling at home with [facility], h/o CVA who demonstrates a likely baseline short term memory deficit without any new cognitive impairments.  Patient reports memory deficits since aneurysm (2005?) however he has been independent with money and medication management.  He demonstrated adequate awareness/insight regarding his limitations given his plan not to drive anymore and unable to return to some activities as his physical disabilites have worsened.  No further SLP services at this time.  SLP Recommendation/Assessment: Patient does not need any further Speech Lanaguage Pathology Services No Skilled Speech Therapy: All education completed;Patient at baseline level of functioning;Patient will have necessary level of assist by caregiver at discharge   Myra Rude, M.S.,CCC-SLP Pager [phone] 08/07/2011, 2:20 PM

## 2011-08-07 NOTE — Progress Notes (Signed)
UR completed 

## 2011-08-07 NOTE — Consult Note (Signed)
Reason for Consult: Positive Troponin  Requesting Physician: [facility]  HPI: This is a 61 y.o. male with a past medical history significant for prior stroke and prior cerebral aneurysm repair. He lives at home with his 60 y/o mother and his nephew. He has chronic Lt sided weakness. He also has a history of myositis. He is ambulatory at home but unsteady and falls. He came to hospital because of knee pain after falling. It was determined that he has been weaker than usual and a CT was done showing a new Lt SDH. Admission labs included CK/MB and Troponin (?not sure why-he denies chest pain or a history of CAD). His second Troponin is 0.4, first and third Troponin are WNL. On telemetry he is in sinus tach at 128. He is tolerating this well.   PMHx:  Past Medical History  Diagnosis Date  . Hypertension   . Arthritis   . Stroke    Past Surgical History  Procedure Date  . Appendectomy   . Cerebral aneurysm repair     FAMHx: No family history on file.  SOCHx:  reports that he has quit smoking. His smoking use included Cigarettes. He quit after 25 years of use. He does not have any smokeless tobacco history on file. His alcohol and drug histories not on file.  ALLERGIES: No Known Allergies  ROS: Pertinent items are noted in HPI.  HOME MEDICATIONS: Prescriptions prior to admission  Medication Sig Dispense Refill  . celecoxib (CELEBREX) 200 MG capsule Take 200 mg by mouth 2 (two) times daily.      . Cholecalciferol (VITAMIN D PO) Take 1,200 mg by mouth daily.      . hydroxychloroquine (PLAQUENIL) 200 MG tablet Take 200 mg by mouth 2 (two) times daily.      . methotrexate (RHEUMATREX) 2.5 MG tablet Take 12.5 mg by mouth 2 (two) times a week. Saturday and Sunday. 5 tablets  Caution:Chemotherapy. Protect from light.      Marland Kitchen oxybutynin (DITROPAN-XL) 5 MG 24 hr tablet Take 5 mg by mouth daily.        Marland Kitchen PHENobarbital (LUMINAL) 64.8 MG tablet Take 64.8 mg by mouth at bedtime.       Marland Kitchen DISCONTD: b  complex vitamins capsule Take 1 capsule by mouth daily.      Marland Kitchen DISCONTD: bismuth subsalicylate (PEPTO BISMOL) 262 MG chewable tablet Chew 524 mg by mouth as needed. For upset stomach      . DISCONTD: cholecalciferol (VITAMIN D) 1000 UNITS tablet Take 1,000 Units by mouth daily.      Marland Kitchen DISCONTD: hydroxychloroquine (PLAQUENIL) 200 MG tablet Take 2 tablets every AM      . DISCONTD: methotrexate 2.5 MG tablet Take 5 mg by mouth See admin instructions. Take 5 mg by mouth on Monday and Tuesday      . DISCONTD: mometasone (NASONEX) 50 MCG/ACT nasal spray Place 2 sprays into the nose daily.      Marland Kitchen DISCONTD: OVER THE COUNTER MEDICATION Take 1 capsule by mouth daily. Mangosteen for arthritis pain      . DISCONTD: simvastatin (ZOCOR) 20 MG tablet Take 20 mg by mouth at bedtime.      Marland Kitchen DISCONTD: UNABLE TO FIND Orthotic Shoes  CVA with balance problems, Left foot bunion       . DISCONTD: diclofenac sodium (VOLTAREN) 1 % GEL Apply to knees once or twice daily         HOSPITAL MEDICATIONS: I have reviewed the patient's current medications.  VITALS:  Blood pressure 113/42, pulse 100, temperature 97.7 F (36.5 C), temperature source Oral, resp. rate 17, height 5\' 6"  (1.676 m), weight 52.5 kg (115 lb 11.9 oz), SpO2 100.00%.  PHYSICAL EXAM: General appearance: alert, cooperative and no distress Neck: no carotid bruit and no JVD Lungs: clear to auscultation bilaterally Heart: regular rythm, increased rate Abdomen: soft, non-tender; bowel sounds normal; no masses,  no organomegaly, lower abdominal midline surgical scar Extremities: muscle wasting Pulses: 2+ and symmetric Skin: pale, cool and dry Neurologic: Grossly normal, awake and alert. He does have Lt arm weakness on exam.  LABS: Results for orders placed during the hospital encounter of 08/06/11 (from the past 48 hour(s))  URINALYSIS, ROUTINE W REFLEX MICROSCOPIC     Status: Abnormal   Collection Time   08/06/11  6:17 PM      Component Value Range  Comment   Color, Urine YELLOW  YELLOW     APPearance CLEAR  CLEAR     Specific Gravity, Urine 1.021  1.005 - 1.030     pH 6.5  5.0 - 8.0     Glucose, UA NEGATIVE  NEGATIVE (mg/dL)    Hgb urine dipstick NEGATIVE  NEGATIVE     Bilirubin Urine NEGATIVE  NEGATIVE     Ketones, ur 40 (*) NEGATIVE (mg/dL)    Protein, ur 30 (*) NEGATIVE (mg/dL)    Urobilinogen, UA 1.0  0.0 - 1.0 (mg/dL)    Nitrite NEGATIVE  NEGATIVE     Leukocytes, UA NEGATIVE  NEGATIVE    URINE MICROSCOPIC-ADD ON     Status: Normal   Collection Time   08/06/11  6:17 PM      Component Value Range Comment   Squamous Epithelial / LPF RARE  RARE     WBC, UA 0-2  <3 (WBC/hpf)    RBC / HPF 0-2  <3 (RBC/hpf)    Bacteria, UA RARE  RARE    CBC     Status: Normal   Collection Time   08/06/11  6:50 PM      Component Value Range Comment   WBC 7.5  4.0 - 10.5 (K/uL)    RBC 4.62  4.22 - 5.81 (MIL/uL)    Hemoglobin 15.4  13.0 - 17.0 (g/dL)    HCT 78.2  95.6 - 21.3 (%)    MCV 95.9  78.0 - 100.0 (fL)    MCH 33.3  26.0 - 34.0 (pg)    MCHC 34.8  30.0 - 36.0 (g/dL)    RDW 08.6  57.8 - 46.9 (%)    Platelets 260  150 - 400 (K/uL)   DIFFERENTIAL     Status: Abnormal   Collection Time   08/06/11  6:50 PM      Component Value Range Comment   Neutrophils Relative 78 (*) 43 - 77 (%)    Neutro Abs 5.8  1.7 - 7.7 (K/uL)    Lymphocytes Relative 13  12 - 46 (%)    Lymphs Abs 1.0  0.7 - 4.0 (K/uL)    Monocytes Relative 9  3 - 12 (%)    Monocytes Absolute 0.6  0.1 - 1.0 (K/uL)    Eosinophils Relative 0  0 - 5 (%)    Eosinophils Absolute 0.0  0.0 - 0.7 (K/uL)    Basophils Relative 1  0 - 1 (%)    Basophils Absolute 0.0  0.0 - 0.1 (K/uL)   COMPREHENSIVE METABOLIC PANEL     Status: Abnormal   Collection Time   08/06/11  6:50 PM      Component Value Range Comment   Sodium 142  135 - 145 (mEq/L)    Potassium 4.8  3.5 - 5.1 (mEq/L)    Chloride 101  96 - 112 (mEq/L)    CO2 32  19 - 32 (mEq/L)    Glucose, Bld 93  70 - 99 (mg/dL)    BUN 7  6 -  23 (mg/dL)    Creatinine, Ser 4.78 (*) 0.50 - 1.35 (mg/dL)    Calcium 9.4  8.4 - 10.5 (mg/dL)    Total Protein 7.5  6.0 - 8.3 (g/dL)    Albumin 4.0  3.5 - 5.2 (g/dL)    AST 33  0 - 37 (U/L)    ALT 21  0 - 53 (U/L)    Alkaline Phosphatase 98  39 - 117 (U/L)    Total Bilirubin 0.5  0.3 - 1.2 (mg/dL)    GFR calc non Af Amer >90  >90 (mL/min)    GFR calc Af Amer >90  >90 (mL/min)   CARDIAC PANEL(CRET KIN+CKTOT+MB+TROPI)     Status: Abnormal   Collection Time   08/06/11  6:50 PM      Component Value Range Comment   Total CK 341 (*) 7 - 232 (U/L)    CK, MB 15.5 (*) 0.3 - 4.0 (ng/mL)    Troponin I <0.30  <0.30 (ng/mL)    Relative Index 4.5 (*) 0.0 - 2.5    PHENOBARBITAL LEVEL     Status: Abnormal   Collection Time   08/06/11  6:50 PM      Component Value Range Comment   Phenobarbital 11.4 (*) 15.0 - 40.0 (ug/mL)   POCT I-STAT TROPONIN I     Status: Abnormal   Collection Time   08/06/11  7:01 PM      Component Value Range Comment   Troponin i, poc 0.43 (*) 0.00 - 0.08 (ng/mL)    Comment NOTIFIED PHYSICIAN      Comment 3            MRSA PCR SCREENING     Status: Normal   Collection Time   08/07/11 12:47 AM      Component Value Range Comment   MRSA by PCR NEGATIVE  NEGATIVE    CARDIAC PANEL(CRET KIN+CKTOT+MB+TROPI)     Status: Abnormal   Collection Time   08/07/11  1:27 AM      Component Value Range Comment   Total CK 296 (*) 7 - 232 (U/L)    CK, MB 10.8 (*) 0.3 - 4.0 (ng/mL)    Troponin I <0.30  <0.30 (ng/mL)    Relative Index 3.6 (*) 0.0 - 2.5    CBC     Status: Normal   Collection Time   08/07/11  1:27 AM      Component Value Range Comment   WBC 5.9  4.0 - 10.5 (K/uL)    RBC 4.29  4.22 - 5.81 (MIL/uL)    Hemoglobin 14.0  13.0 - 17.0 (g/dL)    HCT 29.5  62.1 - 30.8 (%)    MCV 96.5  78.0 - 100.0 (fL)    MCH 32.6  26.0 - 34.0 (pg)    MCHC 33.8  30.0 - 36.0 (g/dL)    RDW 65.7  84.6 - 96.2 (%)    Platelets 245  150 - 400 (K/uL)   BASIC METABOLIC PANEL     Status: Abnormal    Collection Time   08/07/11  1:27 AM  Component Value Range Comment   Sodium 140  135 - 145 (mEq/L)    Potassium 4.1  3.5 - 5.1 (mEq/L)    Chloride 104  96 - 112 (mEq/L)    CO2 28  19 - 32 (mEq/L)    Glucose, Bld 74  70 - 99 (mg/dL)    BUN 7  6 - 23 (mg/dL)    Creatinine, Ser 0.98 (*) 0.50 - 1.35 (mg/dL)    Calcium 8.3 (*) 8.4 - 10.5 (mg/dL)    GFR calc non Af Amer >90  >90 (mL/min)    GFR calc Af Amer >90  >90 (mL/min)   GLUCOSE, CAPILLARY     Status: Abnormal   Collection Time   08/07/11  9:43 AM      Component Value Range Comment   Glucose-Capillary 60 (*) 70 - 99 (mg/dL)    Comment 1 Notify RN      Comment 2 Documented in Chart     GLUCOSE, CAPILLARY     Status: Abnormal   Collection Time   08/07/11 10:06 AM      Component Value Range Comment   Glucose-Capillary 66 (*) 70 - 99 (mg/dL)    Comment 1 Documented in Chart      Comment 2 Notify RN     GLUCOSE, CAPILLARY     Status: Abnormal   Collection Time   08/07/11 11:45 AM      Component Value Range Comment   Glucose-Capillary 138 (*) 70 - 99 (mg/dL)    Comment 1 Notify RN      Comment 2 Documented in Chart       IMAGING: Dg Chest 2 View  08/06/2011  *RADIOLOGY REPORT*  Clinical Data: Weakness  CHEST - 2 VIEW  Comparison: None.  Findings: Degenerative changes of bilateral shoulders. Lungs clear. Heart size and pulmonary vascularity normal.  No effusion.  IMPRESSION: No acute disease  Original Report Authenticated By: Osa Craver, M.D.   Dg Hip Complete Right  08/07/2011  *RADIOLOGY REPORT*  Clinical Data: Right hip pain secondary to a fall.  RIGHT HIP - COMPLETE 2+ VIEW  Comparison: None.  Findings: There is no fracture, dislocation, arthritis, or other acute abnormality of the right hip.  Diffuse osteopenia.  IMPRESSION: No acute abnormality.  Original Report Authenticated By: Gwynn Burly, M.D.   Ct Head Wo Contrast  08/06/2011  *RADIOLOGY REPORT*  Clinical Data: Bilateral lower extremity weakness, fell,  blunt head trauma  CT HEAD WITHOUT CONTRAST  Technique:  Contiguous axial images were obtained from the base of the skull through the vertex without contrast.  Comparison: 03/21/2009  Findings: There is a new small acute left temporal and parietal subdural hematoma, measuring up to 5 mm thickness.  This results in minimal mass effect.  There is no midline shift.  Changes of previous left and right craniotomies without fracture or other acute calvarial abnormality.  Stable right frontal encephalomalacia.  No new parenchymal abnormality.  Acute infarct may be inapparent on noncontrast CT.  IMPRESSION:  1.  New left temporal and parietal subdural hematoma with minimal mass effect. 2.  Stable postoperative changes and right frontal encephalomalacia.  I telephoned the critical test results to Dr. Rubin Payor at the time of interpretation.  Original Report Authenticated By: Osa Craver, M.D.   Dg Knee Complete 4 Views Left  08/06/2011  *RADIOLOGY REPORT*  Clinical Data: Pain and weakness without trauma  LEFT KNEE - COMPLETE 4+ VIEW  Comparison: 02/20/2011  Findings:  No  effusion.  Diffuse osteopenia.  Negative for fracture, dislocation, or other acute bone injury. Normal alignment.  IMPRESSION:  1.  Osteopenia without fracture or other acute abnormality.  Original Report Authenticated By: Osa Craver, M.D.    IMPRESSION: Principal Problem:  *Subdural hematoma Active Problems:  POLYMYOSITIS, weak, unsteady, falls  HYPERLIPIDEMIA, not on statin secondary to polymyositis  HYPERTENSION, BENIGN SYSTEMIC  RHEUMATOID ARTHRITIS (NOT JUVENILE)  History of CVA  Troponin level elevated, one of 3 Troponin elevated at 0.4  Sinus tachycardia   RECOMMENDATION: MD to see. Add low dose beta blocker. Stop statin with history of myositis. Consider 2D when rate controlled.  Time Spent Directly with Patient: 35 minutes  KILROY,LUKE K 08/07/2011, 1:10 PM   I have seen and examined the patient along  with Corine Shelter, PA.  I have reviewed the chart, notes and new data.  I agree with Luke's note.  Brief Description: 61 y.o. male with a past medical history significant for prior stroke and prior cerebral aneurysm repair. He lives at home with his 35 y/o mother and his nephew. He has chronic Lt sided weakness. He also has a history of myositis. He is ambulatory at home but unsteady and falls. He came to hospital because of knee pain after falling. It was determined that he has been weaker than usual and a CT was done showing a new Lt SDH. Admission labs included CK/MB and Troponin. His second Troponin is 0.4, first and third Troponin are WNL. On telemetry he is in sinus tach at 128 -- tolerating this well  Key new complaints: No c/o CP or SOB Key examination changes: agree with Luke's exam Key new findings / data: follow-up Troponin's negative.  I am not sure why cardiac biomarkers were checked.  The POC lab is most likely spurious as the follow on levels are normal.  Of course it is not unheard of to have mild troponin leak with CNS insult. In the absence of CP or ischemic ECG changes, mild + Troponin is more indicative of myonecrosis, but not consistent with NSTEMI.  PLAN:  Would check an echocardiogram when HR more controlled to ensure no localized WMA Would not treat as ACS with anticoagulation  Agree with using BB for Sinus Tachycardia & agree with holding statin given h/o myositis.  Marykay Lex, M.D., M.S. [facility] [address] [place], [place]  [address]  [phone]  08/07/2011 6:58 PM

## 2011-08-07 NOTE — Clinical Documentation Improvement (Signed)
BMI DOCUMENTATION CLARIFICATION QUERY  THIS DOCUMENT IS NOT A PERMANENT PART OF THE MEDICAL RECORD  TO RESPOND TO THE THIS QUERY, FOLLOW THE INSTRUCTIONS BELOW:  1. If needed, update documentation for the patient's encounter via the notes activity.  2. Access this query again and click edit on the In [employer].  3. After updating, or not, click F2 to complete all highlighted (required) fields concerning your review. Select "additional documentation in the medical record" OR "no additional documentation provided".  4. Click Sign note button.  5. The deficiency will fall out of your In Basket *Please let us know if you are not able to complete this workflow by phone or e-mail (listed below).         08/07/11  Dear Dr. Antoine Primas Marton Redwood  In an effort to better capture your patient's severity of illness, reflect appropriate length of stay and utilization of resources, a review of the patient medical record has revealed the following indicators.    Based on your clinical judgment, please clarify and document in a progress note and/or discharge summary the clinical condition associated with the following supporting information:  In responding to this query please exercise your independent judgment.  The fact that a query is asked, does not imply that any particular answer is desired or expected.  Possible Clinical conditions  Underweight w/BMI= 18.7  Other condition___________________  Cannot Clinically determine _____________  Risk Factors: Sign & Symptoms: Weight: 115 lbs 8 oz. Height 5 ft. 6 BMI=18.7    Reviewed:  no additional documentation provided Underweight by definition is BMI less than 18.5 ....   Thank You,  Shelda Pal RN, BSN, CCM   Clinical Documentation Specialist:  Pager [phone]   [facility]

## 2011-08-07 NOTE — Progress Notes (Signed)
08/07/11 0415  Called MD regarding pt inability to urinate. Pt expresses that he doesn't feel the need to urinate, but when bladder scanned pt showed UO greater than 300. MD requests order for foley to be placed. Foley was placed with urine output.   Elisha Headland RN

## 2011-08-07 NOTE — H&P (Signed)
[name] [name] is an 61 y.o. male.   Chief Complaint: Fall with subdural hematoma HPI: 61 year old male with a past medical history significant for rheumatoid arthritis, cerebrovascular accident, hypertension, and history of cerebral aneurysm surgery back in 1996 and a subdural hematoma in 1997 coming in after a fall. Patient states that for the last couple weeks he does seem to have increasing left-sided weakness. Patient has had left-sided residual weakness secondary to a CVA he had previously. Patient states that his left knee sometimes feels that it does give out and he has fallen 3 times in the last 48 hours. Patient is less times it fall and hit his head very hard. Patient denies any loss of consciousness states he is going down for approximately minutes. Patient had a nephew who is very was able to pick him up and brought him to [facility] long emergency department where they did do a CT scan of the head and was found to have a subdural hematoma of 5 mm in thickness with no mass effect. Patient denies any nausea or vomiting only complains of mild headache at this time.  Past Medical History  Diagnosis Date  . Hypertension   . Arthritis   . Stroke     Past Surgical History  Procedure Date  . Appendectomy   . Cerebral aneurysm repair     No family history on file. Social History:  reports that he has quit smoking. His smoking use included Cigarettes. He quit after 25 years of use. He does not have any smokeless tobacco history on file. His alcohol and drug histories not on file.  Allergies: No Known Allergies  Medications Prior to Admission  Medication Dose Route Frequency Provider Last Rate Last Dose  . 0.9 %  sodium chloride infusion   Intravenous Continuous Andrena Mews, DO      . acetaminophen (TYLENOL) tablet 650 mg  650 mg Oral Q4H PRN Andrena Mews, DO       Or  . acetaminophen (TYLENOL) suppository 650 mg  650 mg Rectal Q4H PRN Andrena Mews, DO      . bismuth subsalicylate  (PEPTO BISMOL) chewable tablet 524 mg  524 mg Oral PRN Andrena Mews, DO      . cholecalciferol (VITAMIN D) tablet 1,000 Units  1,000 Units Oral Daily Andrena Mews, DO      . fluticasone [facility]) 50 MCG/ACT nasal spray 1 spray  1 spray Each Nare Daily Andrena Mews, DO      . hydroxychloroquine (PLAQUENIL) tablet 400 mg  400 mg Oral Daily Andrena Mews, DO      . labetalol (NORMODYNE,TRANDATE) injection 10-40 mg  10-40 mg Intravenous Q10 min PRN Andrena Mews, DO      . methotrexate (RHEUMATREX) tablet 5 mg  5 mg Oral Custom Andrena Mews, DO      . ondansetron [facility]) injection 4 mg  4 mg Intravenous Q6H PRN Andrena Mews, DO      . oxybutynin (DITROPAN-XL) 24 hr tablet 5 mg  5 mg Oral Daily Andrena Mews, DO      . pantoprazole (PROTONIX) injection 40 mg  40 mg Intravenous QHS Andrena Mews, DO      . PHENobarbital (LUMINAL) tablet 64.8 mg  64.8 mg Oral QHS Andrena Mews, DO      . senna-docusate (Senokot-S) tablet 1 tablet  1 tablet Oral BID Andrena Mews, DO      . simvastatin (ZOCOR)  tablet 20 mg  20 mg Oral QHS Andrena Mews, DO      . sodium chloride 0.9 % bolus 500 mL  500 mL Intravenous Once Harrold Donath R. Pickering, MD   1,000 mL at 08/06/11 1954  . sodium chloride 0.9 % bolus 500 mL  500 mL Intravenous Once Andrena Mews, DO      . DISCONTD: traMADol Janean Sark) tablet 50-100 mg  50-100 mg Oral Q6H PRN Andrena Mews, DO       Medications Prior to Admission  Medication Sig Dispense Refill  . hydroxychloroquine (PLAQUENIL) 200 MG tablet Take 2 tablets every AM       . methotrexate 2.5 MG tablet Take 5 mg by mouth See admin instructions. Take 5 mg by mouth on Monday and Tuesday      . PHENobarbital (LUMINAL) 64.8 MG tablet Take 64.8 mg by mouth at bedtime.        Marland Kitchen UNABLE TO FIND Orthotic Shoes  CVA with balance problems, Left foot bunion       . diclofenac sodium (VOLTAREN) 1 % GEL Apply to knees once or twice daily       . oxybutynin (DITROPAN-XL) 5 MG 24 hr  tablet Take 5 mg by mouth daily.          Results for orders placed during the hospital encounter of 08/06/11 (from the past 48 hour(s))  URINALYSIS, ROUTINE W REFLEX MICROSCOPIC     Status: Abnormal   Collection Time   08/06/11  6:17 PM      Component Value Range Comment   Color, Urine YELLOW  YELLOW     APPearance CLEAR  CLEAR     Specific Gravity, Urine 1.021  1.005 - 1.030     pH 6.5  5.0 - 8.0     Glucose, UA NEGATIVE  NEGATIVE (mg/dL)    Hgb urine dipstick NEGATIVE  NEGATIVE     Bilirubin Urine NEGATIVE  NEGATIVE     Ketones, ur 40 (*) NEGATIVE (mg/dL)    Protein, ur 30 (*) NEGATIVE (mg/dL)    Urobilinogen, UA 1.0  0.0 - 1.0 (mg/dL)    Nitrite NEGATIVE  NEGATIVE     Leukocytes, UA NEGATIVE  NEGATIVE    URINE MICROSCOPIC-ADD ON     Status: Normal   Collection Time   08/06/11  6:17 PM      Component Value Range Comment   Squamous Epithelial / LPF RARE  RARE     WBC, UA 0-2  <3 (WBC/hpf)    RBC / HPF 0-2  <3 (RBC/hpf)    Bacteria, UA RARE  RARE    CBC     Status: Normal   Collection Time   08/06/11  6:50 PM      Component Value Range Comment   WBC 7.5  4.0 - 10.5 (K/uL)    RBC 4.62  4.22 - 5.81 (MIL/uL)    Hemoglobin 15.4  13.0 - 17.0 (g/dL)    HCT 40.9  81.1 - 91.4 (%)    MCV 95.9  78.0 - 100.0 (fL)    MCH 33.3  26.0 - 34.0 (pg)    MCHC 34.8  30.0 - 36.0 (g/dL)    RDW 78.2  95.6 - 21.3 (%)    Platelets 260  150 - 400 (K/uL)   DIFFERENTIAL     Status: Abnormal   Collection Time   08/06/11  6:50 PM      Component Value Range Comment   Neutrophils Relative  78 (*) 43 - 77 (%)    Neutro Abs 5.8  1.7 - 7.7 (K/uL)    Lymphocytes Relative 13  12 - 46 (%)    Lymphs Abs 1.0  0.7 - 4.0 (K/uL)    Monocytes Relative 9  3 - 12 (%)    Monocytes Absolute 0.6  0.1 - 1.0 (K/uL)    Eosinophils Relative 0  0 - 5 (%)    Eosinophils Absolute 0.0  0.0 - 0.7 (K/uL)    Basophils Relative 1  0 - 1 (%)    Basophils Absolute 0.0  0.0 - 0.1 (K/uL)   COMPREHENSIVE METABOLIC PANEL     Status:  Abnormal   Collection Time   08/06/11  6:50 PM      Component Value Range Comment   Sodium 142  135 - 145 (mEq/L)    Potassium 4.8  3.5 - 5.1 (mEq/L)    Chloride 101  96 - 112 (mEq/L)    CO2 32  19 - 32 (mEq/L)    Glucose, Bld 93  70 - 99 (mg/dL)    BUN 7  6 - 23 (mg/dL)    Creatinine, Ser 1.47 (*) 0.50 - 1.35 (mg/dL)    Calcium 9.4  8.4 - 10.5 (mg/dL)    Total Protein 7.5  6.0 - 8.3 (g/dL)    Albumin 4.0  3.5 - 5.2 (g/dL)    AST 33  0 - 37 (U/L)    ALT 21  0 - 53 (U/L)    Alkaline Phosphatase 98  39 - 117 (U/L)    Total Bilirubin 0.5  0.3 - 1.2 (mg/dL)    GFR calc non Af Amer >90  >90 (mL/min)    GFR calc Af Amer >90  >90 (mL/min)   CARDIAC PANEL(CRET KIN+CKTOT+MB+TROPI)     Status: Abnormal   Collection Time   08/06/11  6:50 PM      Component Value Range Comment   Total CK 341 (*) 7 - 232 (U/L)    CK, MB 15.5 (*) 0.3 - 4.0 (ng/mL)    Troponin I <0.30  <0.30 (ng/mL)    Relative Index 4.5 (*) 0.0 - 2.5    PHENOBARBITAL LEVEL     Status: Abnormal   Collection Time   08/06/11  6:50 PM      Component Value Range Comment   Phenobarbital 11.4 (*) 15.0 - 40.0 (ug/mL)   POCT I-STAT TROPONIN I     Status: Abnormal   Collection Time   08/06/11  7:01 PM      Component Value Range Comment   Troponin i, poc 0.43 (*) 0.00 - 0.08 (ng/mL)    Comment NOTIFIED PHYSICIAN      Comment 3             Dg Chest 2 View  08/06/2011  *RADIOLOGY REPORT*  Clinical Data: Weakness  CHEST - 2 VIEW  Comparison: None.  Findings: Degenerative changes of bilateral shoulders. Lungs clear. Heart size and pulmonary vascularity normal.  No effusion.  IMPRESSION: No acute disease  Original Report Authenticated By: Osa Craver, M.D.   Ct Head Wo Contrast  08/06/2011  *RADIOLOGY REPORT*  Clinical Data: Bilateral lower extremity weakness, fell, blunt head trauma  CT HEAD WITHOUT CONTRAST  Technique:  Contiguous axial images were obtained from the base of the skull through the vertex without contrast.   Comparison: 03/21/2009  Findings: There is a new small acute left temporal and parietal subdural hematoma, measuring up to  5 mm thickness.  This results in minimal mass effect.  There is no midline shift.  Changes of previous left and right craniotomies without fracture or other acute calvarial abnormality.  Stable right frontal encephalomalacia.  No new parenchymal abnormality.  Acute infarct may be inapparent on noncontrast CT.  IMPRESSION:  1.  New left temporal and parietal subdural hematoma with minimal mass effect. 2.  Stable postoperative changes and right frontal encephalomalacia.  I telephoned the critical test results to Dr. Rubin Payor at the time of interpretation.  Original Report Authenticated By: Osa Craver, M.D.   Dg Knee Complete 4 Views Left  08/06/2011  *RADIOLOGY REPORT*  Clinical Data: Pain and weakness without trauma  LEFT KNEE - COMPLETE 4+ VIEW  Comparison: 02/20/2011  Findings: No  effusion.  Diffuse osteopenia.  Negative for fracture, dislocation, or other acute bone injury. Normal alignment.  IMPRESSION:  1.  Osteopenia without fracture or other acute abnormality.  Original Report Authenticated By: Osa Craver, M.D.    Review of Systems  Respiratory: Negative for cough.   Cardiovascular: Negative for chest pain.  Genitourinary: Negative for dysuria.  Musculoskeletal: Negative for myalgias.  Neurological: Negative for tremors, sensory change and loss of consciousness.  Psychiatric/Behavioral: Negative.   Denies fever, chills, nausea vomiting abdominal pain, dysuria, chest pain, shortness of breath dyspnea on exertion or numbness in extremities Patient does complain of a mild headache.  Blood pressure 138/76, pulse 103, temperature 98.4 F (36.9 C), temperature source Oral, resp. rate 22, height 5\' 6"  (1.676 m), weight 115 lb 8.3 oz (52.4 kg), SpO2 100.00%. Physical Exam  General: Well-developed,well-nourished,in no acute distress; alert,appropriate and  cooperative throughout examination  Head: craniotomy scar area at the top portion of the frontal lobe on the left side patient does have a small abrasion where patient likely his head with some mild bruising surrounding it. Eyes: pupils equal, pupils round, and pupils reactive to light.  Ears: R ear normal, L ear normal, and no external deformities.  Nose: no external deformity.  Mouth: poor dentition and teeth missing.  Neck:  No deformities, masses, or tenderness noted.  Chest Wall: No deformities, masses, tenderness or gynecomastia noted.  Lungs: Normal respiratory effort, chest expands symmetrically. Lungs are clear to auscultation, no crackles or wheezes.  Heart: Normal rate and regular rhythm. S1 and S2 normal without gallop, murmur, click, rub or other extra sounds.  Abdomen: Bowel sounds positive,abdomen soft and non-tender without masses, organomegaly or hernias noted.  Msk:  PIP contractures lt 4th and 5th  PIP contracture 5th rt  MCP swelling at both - 2nd greatest  rt wrist limited extension  lt shoulder limited rom and lack of full extension at elbow  Patient does have mild contracture of the left Achilles tendon. neck good rom  Extremities: No edema  Skin: dry but no active inflammatory areas today  Psych: Oriented X3, memory intact for recent and remote, normally interactive, good eye contact, not anxious appearing, and not depressed appearing. Neurologically: Patient is neurovascularly intact in all extremities patient's cranial nerves II through XII are intact pupils are equal round reactive to light and accommodation extraocular movements intact  Assessment/Plan 61 year old male with history of CVA as well as cerebral aneurysm status post clipping back in 1997 coming in after an acute fall with an acute subdural hematoma. #1 subdural hematoma-patient has no cranial nerve defects and no neurologic findings at this time. Patient will be admitted to step down neurological  unit for continued  monitor of his neurologic condition. Will continue to monitor. In addition to this we'll actually have neurology see him tomorrow for their input. We will of course avoid any type of blood thinners. #2 history cardiovascular accident-patient has left-sided residual weakness secondary to this as well as a contracture of the left fourth and fifth finger and left Achilles tendon. These are not changes it appears for quite some time. Patient does have some weakness in her left lower extremity but do not see any new signs of a cardiovascular accident. We'll continue to monitor closely, patient of course cannot be on any blood thinners but we should consider having patient on a ACE inhibitor but for discharge. #3 elevated troponin-this may be secondary to patient being down longer than he states. Patient is not complaining any chest pain patient has no EKG changes, patient does have a history of seizures but states he has not had a seizure for many years. At this time we will cycle cardiac enzymes and continue to monitor. Would not place patient has a high-risk but this also could be a contributing factor to the reason he fell. #4 rheumatoid arthritis-we'll continue his home medications of Plaquenil as well as methotrexate if patient is admitted for a long period which is not anticipated. #5-history of seizure disorder-continue phenobarbital. Patient's level is a little bit low but we'll not make any changes at this time. #6 diet: Patient will be n.p.o. for now we'll consider expanding in the morning once neurology sees patient. #7 prophylaxis we will hold on heparin Lovenox we will do an STD boots in bed with a PPI #8 disposition: Pending evaluation by cardiology as well as neurology.      Judi Saa, DO  08/07/2011, 1:21 AM

## 2011-08-07 NOTE — Plan of Care (Addendum)
Problem: Food- and Nutrition-Related Knowledge Deficit (NB-1.1) Goal: Nutrition education Formal process to instruct or train a patient/client in a skill or to impart knowledge to help patients/clients voluntarily manage or modify food choices and eating behavior to maintain or improve health.  Outcome: Completed/Met Date Met:  08/07/11 Reviewed chart. Patient is a 61 y.o. Male admitted with subdural hematoma and past medical history of polymyositis. Weight is currently 115 lbs (52.5 kg). BMI is 18.68, within normal limits. Patient reported weight had been 180 lbs one year ago, was initial trying to lose weight. Patient has severe loss of body fat and muscle mass on his upper and lower extremities. Patient was interested in learning about high protein foods to incorporate into his diet. Used AND Nutrition Care Manual "High Calorie, High Protein Nutrition Therapy" handout to guide education. Discussed high protein snack options, and importance of having protein with each meals. Provided patient with Ensure coupon to continue supplementation upon discharge. Patient verbalized understanding of recommendations, and was very appreciative of the education. Overall compliance to high-protein diet recommendations seems likely.      Christia Reading, Dietetic Intern  Hettie Holstein [phone]

## 2011-08-08 DIAGNOSIS — I319 Disease of pericardium, unspecified: Secondary | ICD-10-CM

## 2011-08-08 LAB — BASIC METABOLIC PANEL
BUN: 7 mg/dL (ref 6–23)
Calcium: 7.9 mg/dL — ABNORMAL LOW (ref 8.4–10.5)
Creatinine, Ser: 0.21 mg/dL — ABNORMAL LOW (ref 0.50–1.35)
GFR calc Af Amer: >90 mL/min (ref >90)
GFR calc non Af Amer: >90 mL/min (ref >90)

## 2011-08-08 LAB — CBC
HCT: 34.1 % — ABNORMAL LOW (ref 39.0–52.0)
MCHC: 34 g/dL (ref 30.0–36.0)
Platelets: 185 10*3/uL (ref 150–400)
RDW: 13.7 % (ref 11.5–15.5)
WBC: 3.8 10*3/uL — ABNORMAL LOW (ref 4.0–10.5)

## 2011-08-08 LAB — SEDIMENTATION RATE: Sed Rate: 16 mm/hr (ref 0–16)

## 2011-08-08 LAB — GLUCOSE, CAPILLARY: Glucose-Capillary: 122 mg/dL — ABNORMAL HIGH (ref 70–99)

## 2011-08-08 LAB — VITAMIN D 25 HYDROXY (VIT D DEFICIENCY, FRACTURES): Vit D, 25-Hydroxy: 12 ng/mL — ABNORMAL LOW (ref 30–89)

## 2011-08-08 MED ORDER — VITAMIN D (ERGOCALCIFEROL) 1.25 MG (50000 UNIT) PO CAPS
50000.0000 [IU] | ORAL_CAPSULE | Freq: Once | ORAL | Status: AC
  Administered 2011-08-08: 50000 [IU] via ORAL
  Filled 2011-08-08: qty 1

## 2011-08-08 MED ORDER — EZETIMIBE 10 MG PO TABS
10.0000 mg | ORAL_TABLET | Freq: Every day | ORAL | Status: DC
  Administered 2011-08-08 – 2011-08-09 (×2): 10 mg via ORAL
  Filled 2011-08-08 (×2): qty 1

## 2011-08-08 MED ORDER — METOPROLOL TARTRATE 25 MG PO TABS
37.5000 mg | ORAL_TABLET | Freq: Two times a day (BID) | ORAL | Status: DC
  Administered 2011-08-08 – 2011-08-09 (×2): 37.5 mg via ORAL
  Filled 2011-08-08 (×3): qty 1

## 2011-08-08 NOTE — Progress Notes (Signed)
Family Medicine Teaching Service Daily Resident Note   Patient name: [name] [name] Medical record number: [id number] Date of birth: 1950-07-01 Age: 61 y.o. Gender: male Length of Stay:  LOS: 2 days             SUBJECTIVE & OVERNIGHT EVENTS  Patient reports feeling much better.  Reports 20 pound weight loss over the past 3 months however per nutrition notes has apparently lost 65 pounds over the past one year.  Patient denies any changes in bowel habits, no melena, no hematochezia.  No fevers no night sweats            OBJECTIVE  Vitals: Patient Vitals for the past 24 hrs:  BP Temp Temp src Pulse Resp SpO2 Weight  08/08/11 0726 119/70 mmHg 98 F (36.7 C) Oral 82  15  99 % -  08/08/11 0350 111/59 mmHg 98.4 F (36.9 C) Oral 82  19  99 % 115 lb 11.9 oz (52.5 kg)  08/07/11 2335 100/58 mmHg 98.9 F (37.2 C) Oral 68  19  100 % -  08/07/11 1850 110/51 mmHg 97.5 F (36.4 C) Oral 87  19  100 % -  08/07/11 1525 122/64 mmHg 98.1 F (36.7 C) Oral 108  17  100 % -   Wt Readings from Last 3 Encounters:  08/08/11 115 lb 11.9 oz (52.5 kg)  07/30/10 122 lb (55.339 kg)  12/14/09 116 lb (52.617 kg)    Intake/Output Summary (Last 24 hours) at 08/08/11 1206 Last data filed at 08/08/11 1057  Gross per 24 hour  Intake 4443.75 ml  Output   3265 ml  Net 1178.75 ml    PE: GENERAL:  Adult Caucasian male examined in [facility].  Appearance greater than age. PSYCH: Alert and appropriately interactive oriented X4; thought content appropriate HNEENT:  H&N: supple and trachea midline; well healed craniotomy scar over R temporal/frontal area OROPHARYNX: lips, mucosa, and tongue normal; teeth and gums normal EYES: conjunctivae/corneas clear. PERRL, EOM's intact. Fundi benign. NOSE: normal THORAX: HEART: RRR, S1/S2 heard, no murmur LUNGS: CTA B, no wheezes, no crackles ABDOMEN:  +BS soft non-tender EXTREMITIES: No edema; contracture of L achilles >NEURO: CN II - XII intact, lateralization of L side  unchanged from prior exams; diffusely weakened LABS: Hematology:  Lab 08/08/11 0400 08/07/11 0127 08/06/11 1850  WBC 3.8* 5.9 7.5  HGB 11.6* 14.0 15.4  HCT 34.1* 41.4 44.3  PLT 185 245 260  RDW 13.7 13.6 13.7  MCV 97.2 96.5 95.9  MCHC 34.0 33.8 34.8    Lab 08/06/11 1850  LYMPHSABS 1.0  EOSABS 0.0  BASOSABS 0.0   Metabolic:  Lab 08/08/11 0400 08/07/11 0127 08/06/11 1850  NA 137 140 142  K 4.0 4.1 4.8  CL 105 104 101  CO2 26 28 32  BUN 7 7 7   CREATININE 0.21* 0.22* 0.21*  GLUCOSE 92 74 93  CALCIUM 7.9* 8.3* 9.4  MG -- -- --  PHOS -- -- --    Lab 08/06/11 1850  AST 33  ALKPHOS 98  BILITOT 0.5  BILIDIR --  PROT 7.5  ALBUMIN 4.0  PREALBUMIN --   No results found for this basename: CHOL,TRIG,HDL,LDL in the last 168 hours No results found for this basename: TSH,T3FREE,FREET4 in the last 168 hours  Lab 08/08/11 0811 08/07/11 2151 08/07/11 1631 08/07/11 1145 08/07/11 1006 08/07/11 0943  GLUCAP 87 106* 128* 138* 66* 60*   No results found for this basename: HGBA1C:3 in the last 168 hours Other  Labs:  Lab 08/07/11 1635 08/07/11 1231 08/07/11 0127  CKTOTAL 636* 600* 296*  TROPONINI <0.30 <0.30 <0.30  TROPONINT -- -- --  CKMBINDEX -- -- --   Vit-D: 12 Phenobarbital Level 11.4 3/6 - Direct LDL - 151  IMAGING: None New  MEDS:    . ezetimibe  10 mg Oral Daily  . feeding supplement  237 mL Oral TID WC  . fluticasone  1 spray Each Nare Daily  . hydroxychloroquine  400 mg Oral Daily  . methotrexate  5 mg Oral Custom  . metoprolol tartrate  37.5 mg Oral BID  . pantoprazole  40 mg Oral Q1200  . PHENobarbital  64.8 mg Oral QHS  . senna-docusate  1 tablet Oral BID  . Tamsulosin HCl  0.4 mg Oral QPC supper  . Vitamin D (Ergocalciferol)  50,000 Units Oral Once  . DISCONTD: cholecalciferol  1,000 Units Oral Daily  . DISCONTD: metoprolol tartrate  25 mg Oral BID  . DISCONTD: pantoprazole (PROTONIX) IV  40 mg Intravenous QHS  . DISCONTD: simvastatin  20 mg Oral  QHS   acetaminophen, acetaminophen, bismuth subsalicylate, labetalol, ondansetron (ZOFRAN) IV I have reviewed all medications and made changes as below.            ASSESSMENT & PLAN  61 year old male with history of CVA as well as cerebral aneurysm status post clipping, presented status post fall found to have an acute subdural hematoma. Hospital Day #2  1. NEURO (Subdural Hematoma, S/p prior CVA with L hemiplegia, Cerebral Aneurysm s/p repair, Seizure Disorder) - Followed by Dr. Franky Macho (Neurosurgery) *Phenobarbital - Will contact Dr. Franky Macho to ensure follow up - non surgical upon presentation  - Would appreciate recommendations for re-imagaing - PT recommending In Pt Rehab - Rehab consult recommending SNF - insurance will not approve CIR at this time  [ ]  consult to SW for placement for SNF  2.  CV (Elevated CK-MB/CK, Sinus Tachycardia) - Followed by [facility] *Lopressor 37.5mg  - 2D ECHO performed to asses WMA and EF >>> pending read  3. ENDO/METABOLIC (HLD, Vitamin D deficiency) - Will avoid statins but elevated LDL *[name] *Vit D 50,000 q week  4. GU (Urinary Retention) - poor urinary output following admission - cath with UOP > 400cc *Flomax 0.4mg  po qhs <<<   5. MSK (RA, Myositis, R hip pain, L Ankle Spacisity ) - Will avoid statins due to hx - likely cause of elevated CK & CK-MB *Methotrexate *Plaquenil - no changes at this time  --- FEN (Protein-Calorie Malnutrition & Weight loss) *NS 171ml/hr - Regular Diet * Ensure clinical strength - Hemocult and ESR for concerns of unexplained wt loss - ?malignancy --- PPx: PPI - SCDs - NO CHEMICAL DVT prophylaxis 2o/2 bleed             DISPOSITION  Pt will need to be seen by Neuro Surg for further disposition - poor ambulatory status at this time.  Will need SNF for acute rehab.    Andrena Mews, DO [facility] Resident - PGY-1 08/08/2011 12:06 PM

## 2011-08-08 NOTE — Progress Notes (Signed)
Rehab consult completed. Dr. Wynn Banker recommends SNF level rehab at this time. Insurance, [employer], will not approve CIR at this time. Please call for any questions. Pager [phone]

## 2011-08-08 NOTE — Progress Notes (Signed)
[facility] This is a 61 y.o. male with a past medical history significant for prior stroke and prior cerebral aneurysm repair. He lives at home with his 33 y/o mother and his nephew. He has chronic Lt sided weakness. He also has a history of myositis. He is ambulatory at home but unsteady and falls. He came to hospital because of knee pain after falling. It was determined that he has been weaker than usual and a CT was done showing a new Lt SDH. Admission labs included CK/MB and Troponin (?not sure why-he denies chest pain or a history of CAD). His second Troponin is 0.4, first and third Troponin are WNL. On telemetry he is in sinus tach at 128. He is tolerating this well  Subjective: No complaints.  Objective: Vital signs in last 24 hours: Temp:  [97.5 F (36.4 C)-98.9 F (37.2 C)] 98 F (36.7 C) (03/15 0726) Pulse Rate:  [68-116] 82  (03/15 0726) Resp:  [15-19] 15  (03/15 0726) BP: (100-122)/(51-70) 119/70 mmHg (03/15 0726) SpO2:  [99 %-100 %] 99 % (03/15 0726) Weight:  [52.5 kg (115 lb 11.9 oz)] 52.5 kg (115 lb 11.9 oz) (03/15 0350) Last BM Date: 08/06/11  Intake/Output from previous day: 03/14 0701 - 03/15 0700 In: 3955 [P.O.:1080; I.V.:2875] Out: 1390 [Urine:1390] Intake/Output this shift: Total I/O In: 615 [P.O.:240; I.V.:375] Out: 525 [Urine:525]  Medications Current Facility-Administered Medications  Medication Dose Route Frequency Provider Last Rate Last Dose  . 0.9 %  sodium chloride infusion   Intravenous Continuous Andrena Mews, DO 125 mL/hr at 08/07/11 1347    . acetaminophen (TYLENOL) tablet 650 mg  650 mg Oral Q4H PRN Andrena Mews, DO   650 mg at 08/07/11 0149   Or  . acetaminophen (TYLENOL) suppository 650 mg  650 mg Rectal Q4H PRN Andrena Mews, DO      . bismuth subsalicylate (PEPTO BISMOL) chewable tablet 524 mg  524 mg Oral PRN Andrena Mews, DO      . cholecalciferol (VITAMIN D) tablet 1,000 Units  1,000 Units Oral  Daily Andrena Mews, DO   1,000 Units at 08/08/11 4098  . feeding supplement (ENSURE CLINICAL STRENGTH) liquid 237 mL  237 mL Oral TID WC Hettie Holstein, RD   237 mL at 08/08/11 0827  . fluticasone (FLONASE) 50 MCG/ACT nasal spray 1 spray  1 spray Each Nare Daily Andrena Mews, DO   1 spray at 08/08/11 1191  . hydroxychloroquine (PLAQUENIL) tablet 400 mg  400 mg Oral Daily Andrena Mews, DO   400 mg at 08/08/11 4782  . labetalol (NORMODYNE,TRANDATE) injection 10-40 mg  10-40 mg Intravenous Q10 min PRN Andrena Mews, DO      . methotrexate (RHEUMATREX) tablet 5 mg  5 mg Oral Custom Andrena Mews, DO      . metoprolol tartrate (LOPRESSOR) tablet 25 mg  25 mg Oral BID Eda Paschal Elsie, [place]   25 mg at 08/08/11 0924  . ondansetron (ZOFRAN) injection 4 mg  4 mg Intravenous Q6H PRN Andrena Mews, DO      . pantoprazole (PROTONIX) EC tablet 40 mg  40 mg Oral Q1200 Nestor Ramp, MD   40 mg at 08/07/11 1605  . PHENobarbital (LUMINAL) tablet 64.8 mg  64.8 mg Oral QHS Andrena Mews, DO   64.8 mg at 08/07/11 2130  . senna-docusate (Senokot-S) tablet 1 tablet  1 tablet Oral BID Andrena Mews, DO  1 tablet at 08/08/11 0924  . Tamsulosin HCl (FLOMAX) capsule 0.4 mg  0.4 mg Oral QPC supper Andrena Mews, DO   0.4 mg at 08/07/11 1739  . DISCONTD: pantoprazole (PROTONIX) injection 40 mg  40 mg Intravenous QHS Andrena Mews, DO      . DISCONTD: simvastatin (ZOCOR) tablet 20 mg  20 mg Oral QHS Andrena Mews, DO   20 mg at 08/07/11 0149    PE: General appearance: alert, cooperative and no distress Lungs: clear to auscultation bilaterally Heart: regular rate and rhythm, S1, S2 normal, no murmur, click, rub or gallop Extremities: No LEE Pulses: 2+ and symmetric  Lab Results:   Basename 08/08/11 0400 08/07/11 0127 08/06/11 1850  WBC 3.8* 5.9 7.5  HGB 11.6* 14.0 15.4  HCT 34.1* 41.4 44.3  PLT 185 245 260   BMET  Basename 08/08/11 0400 08/07/11 0127 08/06/11 1850  NA 137 140 142    K 4.0 4.1 4.8  CL 105 104 101  CO2 26 28 32  GLUCOSE 92 74 93  BUN 7 7 7   CREATININE 0.21* 0.22* 0.21*  CALCIUM 7.9* 8.3* 9.4   Assessment/Plan    Principal Problem:  *Subdural hematoma Active Problems:  HYPERLIPIDEMIA, not on statin secondary to polymyositis  HYPERTENSION, BENIGN SYSTEMIC  POLYMYOSITIS, weak, unsteady, falls  RHEUMATOID ARTHRITIS (NOT JUVENILE)  History of CVA  Troponin level elevated, one of 3 Troponin elevated at 0.4  Sinus tachycardia   Plan:  Continues to have sinus tach on monitor earlier(140's).  Rate in the 80's currently.  Will titrate lopressor to 37.5bid,   CK/CKMB elevated.  Troponin WNL.  BP well controlled.    LOS: 2 days    HAGER,BRYAN W 08/08/2011 10:25 AM   Patient seen and examined. Agree with assessment and plan. No chest pain or SOB. Pt has a history of HTN and hyperlipidemia. Will further titrate beta-blocker as BP allows.  For 2d-echo to assess systolic and diastolic function. CK positive, Trop negative.  Consider trial of Zetia with LDL 155.   Lennette Bihari, MD, [facility] 08/08/2011 10:50 AM

## 2011-08-08 NOTE — Progress Notes (Signed)
Subjective: Patient reports feeling good.  Objective: Vital signs in last 24 hours: Temp:  [97.5 F (36.4 C)-98.9 F (37.2 C)] 98 F (36.7 C) (03/15 1145) Pulse Rate:  [67-108] 67  (03/15 1145) Resp:  [15-19] 15  (03/15 1145) BP: (100-122)/(51-70) 113/69 mmHg (03/15 1145) SpO2:  [99 %-100 %] 100 % (03/15 1145) Weight:  [52.5 kg (115 lb 11.9 oz)] 52.5 kg (115 lb 11.9 oz) (03/15 0350)  Intake/Output from previous day: 03/14 0701 - 03/15 0700 In: 3955 [P.O.:1080; I.V.:2875] Out: 1390 [Urine:1390] Intake/Output this shift: Total I/O In: 2085 [P.O.:960; I.V.:1125] Out: 2575 [Urine:2575]  Neurologic: Mental status: Alert, oriented, thought content appropriate Cranial nerves: II: pupils equal, round, reactive to light and accommodation, III,IV,VI: extraocular muscles extra-ocular motions intact, VII: upper facial muscle function normal bilaterally, VII: lower facial muscle function normal bilaterally, VIII: hearing intact to voice, IX: soft palate elevation normal bilaterally, XII: tongue strength normal  Motor: left sided weakness, normal on right. +drift bilaterally  Lab Results:  Basename 08/08/11 0400 08/07/11 0127  WBC 3.8* 5.9  HGB 11.6* 14.0  HCT 34.1* 41.4  PLT 185 245   BMET  Basename 08/08/11 0400 08/07/11 0127  NA 137 140  K 4.0 4.1  CL 105 104  CO2 26 28  GLUCOSE 92 74  BUN 7 7  CREATININE 0.21* 0.22*  CALCIUM 7.9* 8.3*    Studies/Results: Dg Chest 2 View  08/06/2011  *RADIOLOGY REPORT*  Clinical Data: Weakness  CHEST - 2 VIEW  Comparison: None.  Findings: Degenerative changes of bilateral shoulders. Lungs clear. Heart size and pulmonary vascularity normal.  No effusion.  IMPRESSION: No acute disease  Original Report Authenticated By: Osa Craver, M.D.   Dg Hip Complete Right  08/07/2011  *RADIOLOGY REPORT*  Clinical Data: Right hip pain secondary to a fall.  RIGHT HIP - COMPLETE 2+ VIEW  Comparison: None.  Findings: There is no fracture,  dislocation, arthritis, or other acute abnormality of the right hip.  Diffuse osteopenia.  IMPRESSION: No acute abnormality.  Original Report Authenticated By: Gwynn Burly, M.D.   Ct Head Wo Contrast  08/06/2011  *RADIOLOGY REPORT*  Clinical Data: Bilateral lower extremity weakness, fell, blunt head trauma  CT HEAD WITHOUT CONTRAST  Technique:  Contiguous axial images were obtained from the base of the skull through the vertex without contrast.  Comparison: 03/21/2009  Findings: There is a new small acute left temporal and parietal subdural hematoma, measuring up to 5 mm thickness.  This results in minimal mass effect.  There is no midline shift.  Changes of previous left and right craniotomies without fracture or other acute calvarial abnormality.  Stable right frontal encephalomalacia.  No new parenchymal abnormality.  Acute infarct may be inapparent on noncontrast CT.  IMPRESSION:  1.  New left temporal and parietal subdural hematoma with minimal mass effect. 2.  Stable postoperative changes and right frontal encephalomalacia.  I telephoned the critical test results to Dr. Rubin Payor at the time of interpretation.  Original Report Authenticated By: Osa Craver, M.D.   Dg Knee Complete 4 Views Left  08/06/2011  *RADIOLOGY REPORT*  Clinical Data: Pain and weakness without trauma  LEFT KNEE - COMPLETE 4+ VIEW  Comparison: 02/20/2011  Findings: No  effusion.  Diffuse osteopenia.  Negative for fracture, dislocation, or other acute bone injury. Normal alignment.  IMPRESSION:  1.  Osteopenia without fracture or other acute abnormality.  Original Report Authenticated By: Osa Craver, M.D.    Assessment/Plan:  Neurologically doing well. No need for repeat CT. No followup necessary.  LOS: 2 days     [name] [name] 08/08/2011, 3:06 PM

## 2011-08-08 NOTE — Progress Notes (Signed)
Clinical Social Work Department CLINICAL SOCIAL WORK PLACEMENT NOTE 08/08/2011  Patient:  EZEL, VALLONE  Account Number:  [id number] Admit date:  08/06/2011  Clinical Social Worker:  Genelle Bal, LCSW  Date/time:  08/08/2011 03:59 PM  Clinical Social Work is seeking post-discharge placement for this patient at the following level of care:      (*CSW will update this form in Epic as items are completed)   08/08/2011  Patient/family provided with [facility] Department of Clinical Social Work's list of facilities offering this level of care within the geographic area requested by the patient (or if unable, by the patient's family).  08/08/2011  Patient/family informed of their freedom to choose among providers that offer the needed level of care, that participate in Medicare, Medicaid or managed care program needed by the patient, have an available bed and are willing to accept the patient.    Patient/family informed of [facility]' ownership interest in [facility], as well as of the fact that they are under no obligation to receive care at this facility.  PASARR submitted to EDS on 08/08/2011 PASARR number received from [employer] on   FL2 transmitted to all facilities in geographic area requested by pt/family on  08/08/2011 FL2 transmitted to all facilities within larger geographic area on   Patient informed that his/her managed care company has contracts with or will negotiate with  certain facilities, including the following:   Clinical Information faxed to  [facility] 08/08/11     Patient/family informed of bed offers received:   Patient chooses bed at  Physician recommends and patient chooses bed at    Patient to be transferred to  on   Patient to be transferred to facility by   The following physician request were entered in Epic:   Additional Comments:

## 2011-08-08 NOTE — Progress Notes (Signed)
I interviewed and examined this patient and discussed the care plan with Dr. Berline Chough and the [facility] team and agree with assessment and plan as documented in the progress note for today.    Shandale Malak A. Sheffield Slider, MD Family Medicine Teaching Service Attending  08/08/2011 9:38 PM

## 2011-08-08 NOTE — Progress Notes (Addendum)
Clinical Social Work Department BRIEF PSYCHOSOCIAL ASSESSMENT 08/08/2011  Patient:  [name] [name], [name] [name]     Account Number:  [id number]     Admit date:  08/06/2011  Clinical Social Worker:  Delmer Islam  Date/Time:  08/08/2011 02:31 PM  Referred by:  Physician  Date Referred:  08/08/2011 Referred for  SNF Placement   Other Referral:   Interview type:  Patient Other interview type:    PSYCHOSOCIAL DATA Living Status:  Mother and Nephew Admitted from facility: Level of care:   Primary support name:  [name] [name] Primary support relationship to patient:  PARENT Degree of support available:   Good    CURRENT CONCERNS Current Concerns  Post-Acute Placement   Other Concerns:    SOCIAL WORK ASSESSMENT / PLAN Clinical Social Worker talked with patient and family (mother-[name] [name] and nephew [name] [name]) regarding discharge plans. Explained the need for short-term rehab in an acute care setting per MD, PT and OT evaluations. Also advised patient that inpatient rehab had evaluation patient and his insurance will not pay for rehab services in the hospital. Patient's first choice is to go home, however he understands the need for ST-rehab and is in agreement with going. CSW provided patient with SNF list and explained bed search process and patient expressed understanding.   Assessment/plan status:  Psychosocial Support/Ongoing Assessment of Needs Other assessment/ plan:   Information/referral to community resources:   Skilled Nursing Facility lists    PATIENT'S/FAMILY'S RESPONSE TO PLAN OF CARE: Mr. Rosiak and his family are very pleasant and patient is agreeable to short-term rehab. At this point, he has not expressed a preference in facillities, however his family will assist him in choosing a SNF.

## 2011-08-08 NOTE — Progress Notes (Signed)
Clinical Social Worker completed the psychosocial assessment, which can be found in the shadow chart. FL-2 completed and placed in shadow chart for MD signature and skilled nursing facility search initiated. CSW will follow up with patient with bed offers.  Stclair Szymborski, MSW, LCSW [phone] 

## 2011-08-08 NOTE — Progress Notes (Signed)
*  PRELIMINARY RESULTS* Echocardiogram 2D Echocardiogram has been performed.  Glean Salen [facility] 08/08/2011, 11:32 AM

## 2011-08-08 NOTE — Evaluation (Signed)
Occupational Therapy Evaluation Patient Details Name: [name] [name] MRN: [id number] DOB: Jan 14, 1951 Today's Date: 08/08/2011  Problem List:  Patient Active Problem List  Diagnoses  . 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 HYPERTENSION, BENIGN SYSTEMIC  . POLYMYOSITIS, weak, unsteady, falls  . RHEUMATOID ARTHRITIS (NOT JUVENILE)  . FIBROMYALGIA, FIBROMYOSITIS  . History of CVA  . CONVULSIONS, SEIZURES, NOS  . INCONTINENCE, URGE  . VERTEBRAL FRACTURE  . Dermatitis  . Subdural hematoma  . Troponin level elevated, one of 3 Troponin elevated at 0.4  . Sinus tachycardia  . RA (rheumatoid arthritis)    Past Medical History:  Past Medical History  Diagnosis Date  . Hypertension   . Arthritis   . Stroke   . SDH (subdural hematoma)   . Seizure disorder   . RA (rheumatoid arthritis)    Past Surgical History:  Past Surgical History  Procedure Date  . Appendectomy   . Cerebral aneurysm repair     with left hemiparesis.  . Subdural hematoma evacuation via craniotomy     OT Assessment/Plan/Recommendation OT Assessment Clinical Impression Statement: Pt admitted after fall with SDH, small L temporal and pariental.  Pt with a hx of stroke, polymyositis and RA.  Pt has a hx of multiple falls.  Pt now requires max assist for OOB and LB ADL, min assist for UE ADL at EOB.  Pt has good potential to be more functional and safe with mobility and ADL and is willing to consider use of a w/c and AE/DME for self care.  Will follow acutely.  Will need further therapy at d/c in an inpatient setting prior to return home. OT Recommendation/Assessment: Patient will need skilled OT in the acute care venue OT Problem List: Decreased strength;Decreased activity tolerance;Impaired balance (sitting and/or standing);Decreased coordination;Decreased knowledge of use of DME or AE;Decreased safety awareness;Impaired UE functional use;Cardiopulmonary status limiting activity OT Therapy Diagnosis  : Generalized weakness OT Plan OT Frequency: Min 2X/week OT Treatment/Interventions: Self-care/ADL training;DME and/or AE instruction;Patient/family education;Balance training OT Recommendation Follow Up Recommendations: Inpatient Rehab;Skilled nursing facility Equipment Recommended: Defer to next venue Individuals Consulted Consulted and Agree with Results and Recommendations: Patient OT Goals Acute Rehab OT Goals OT Goal Formulation: With patient Time For Goal Achievement: 2 weeks ADL Goals Pt Will Perform Upper Body Bathing: with set-up;Sitting, edge of bed ADL Goal: Upper Body Bathing - Progress: Goal set today Pt Will Perform Lower Body Bathing: with min assist;Supine, rolling right and/or left;Sitting, edge of bed;with adaptive equipment ADL Goal: Lower Body Bathing - Progress: Goal set today Pt Will Perform Lower Body Dressing: Supine, rolling right and/or left;with min assist;with adaptive equipment;Sitting, bed ADL Goal: Lower Body Dressing - Progress: Goal set today Pt Will Transfer to Toilet: with min assist;3-in-1;with transfer board ADL Goal: Toilet Transfer - Progress: Goal set today Pt Will Perform Toileting - Clothing Manipulation: with min assist;Sitting on 3-in-1 or toilet ADL Goal: Toileting - Clothing Manipulation - Progress: Goal set today Pt Will Perform Toileting - Hygiene: with supervision;Leaning right and/or left on 3-in-1/toilet ADL Goal: Toileting - Hygiene - Progress: Goal set today  OT Evaluation Precautions/Restrictions  Precautions Precautions: Fall Prior Functioning Home Living Lives With: Family;Other (Comment) (37 y o mother and nephew) Receives Help From: Family Type of Home: House Home Layout: One level Home Access: Stairs to enter;Other (comment) (family is building a ramp) Entrance Stairs-Rails: Right;Left;Can reach both Entrance Stairs-Number of Steps: 2 Bathroom Shower/Tub: Tub/shower unit;Other (comment) (pt sponge bathes at  sink) Bathroom Toilet:  Standard (keeps 3 in 1 over toilet) Home Adaptive Equipment: Bedside commode/3-in-1;Walker - rolling;Straight cane Additional Comments: Theraband for exercies. (pt admits to not being faithful to HEP.) Prior Function Level of Independence: Independent with basic ADLs;Needs assistance with homemaking;Needs assistance with tranfers;Requires assistive device for independence;Independent with gait Driving: No Vocation: On disability ADL ADL Eating/Feeding: Simulated;Set up Where Assessed - Eating/Feeding: Bed level Grooming: Performed;Wash/dry hands;Supervision/safety;Set up Where Assessed - Grooming: Sitting, bed Upper Body Bathing: Simulated;Minimal assistance Where Assessed - Upper Body Bathing: Sitting, bed Lower Body Bathing: Simulated;Maximal assistance Where Assessed - Lower Body Bathing: Sitting, bed;Sit to stand from bed Upper Body Dressing: Simulated;Supervision/safety Where Assessed - Upper Body Dressing: Sitting, bed Lower Body Dressing: Performed;Maximal assistance Lower Body Dressing Details (indicate cue type and reason): crosses foot over opposite knee, needs supervision for dynamic sitting  Where Assessed - Lower Body Dressing: Sitting, bed;Sit to stand from bed ADL Comments: Pt typically stands at the sink and bathes.  Is not aware of tub transfer bendh, but would like to explore this option.  Pt states that if he leans forward to perform LB ADL in sitting position he tends to fall.  Typically able to bring foot to opposite knee or up onto bed.  Pt with difficulty fastening belt in standing due to poor balance.  Mother helps him from sit to stand from all surfaces including his 3 in 1.  Nephew is of minimum phyical assistance and mother is beginning to complain about having to pull pt up as she is at an advanced age.  Pt is willing to consider ADL and mobility from a w/c level.  Vision/Perception  Vision - History Patient Visual Report: No change from  baseline Cognition Cognition Arousal/Alertness: Awake/alert Overall Cognitive Status: Appears within functional limits for tasks assessed Orientation Level: Oriented X4 Sensation/Coordination Coordination Gross Motor Movements are Fluid and Coordinated: No Fine Motor Movements are Fluid and Coordinated: Yes Coordination and Movement Description: Decreased co-ordination of bil lower extremities Extremity Assessment RUE Assessment RUE Assessment: Exceptions to WFL (full AROM, 4-/5 strength, RA changes in hands.) LUE Assessment LUE Assessment: Exceptions to WFL (Full AROM, 3/5 strength, RA changes in hand) Mobility  Bed Mobility Bed Mobility: Yes Rolling Left: 5: Supervision;With rail Left Sidelying to Sit: 4: Min assist;With rails Left Sidelying to Sit Details (indicate cue type and reason): Patient with improved fluidity of movement with transition of trunk Sitting - Scoot to Edge of Bed: 4: Min assist Sitting - Scoot to Edge of Bed Details (indicate cue type and reason): For reciprocal action Sit to Supine: 4: Min assist Sit to Supine - Details (indicate cue type and reason): for LEs Transfers Transfers: No (pt declined, only tolerated chair 15 min this morning) Sit to Stand: From bed;2: Max assist;Without upper extremity assist Sit to Stand Details (indicate cue type and reason): Pt with flexed knees and not able to attain flat foot on his own during standing. Stand to Sit: To bed;With upper extremity assist;2: Max assist Stand to Sit Details: For control of descent End of Session OT - End of Session Equipment Utilized During Treatment: Gait belt Activity Tolerance: Treatment limited secondary to medical complications (Comment) (HR increases with activity) Patient left: in bed;with call bell in reach General Behavior During Session: [facility] for tasks performed Cognition: [facility] for tasks performed   Evern Bio 08/08/2011, 12:25 PM [phone]

## 2011-08-08 NOTE — Progress Notes (Signed)
Physical Therapy Treatment Patient Details Name: [name] [name] MRN: [id number] DOB: 05/30/1950 Today's Date: 08/08/2011  PT Assessment/Plan  PT - Assessment/Plan Comments on Treatment Session: Patient with significant weakness of bilateral lower extremities and limited by HR today. However, patient remains an excellent rehab candidiate he has excellent family support (they were providing 24 hour care PTA). They are currently building a ramp for improved access to the house as his nephew has realistic expectations of his future mobility. Given improved abilities with trunk muscle activation today and his leg weakness I feel that working on sliding board transfers and wheelchair mobility will be  necessary in future treatments to maximize his independence and safety with functional mobility and decr. the burden of care on his family. I believe that reaching at least supervision level for bed mobility and W/C transfers and mobility is a highly realistic goal for this motivated patient. PT Plan: Discharge plan remains appropriate Follow Up Recommendations: Inpatient Rehab Equipment Recommended: Defer to next venue PT Goals  Acute Rehab PT Goals PT Goal: Supine/Side to Sit - Progress: Progressing toward goal PT Goal: Sit at Edge Of Bed - Progress: Progressing toward goal PT Goal: Sit to Stand - Progress: Progressing toward goal PT Goal: Stand to Sit - Progress: Progressing toward goal PT Transfer Goal: Bed to Chair/Chair to Bed - Progress: Progressing toward goal PT Goal: Stand - Progress: Progressing toward goal  PT Treatment Precautions/Restrictions  Precautions Precautions: Fall Mobility (including Balance) Bed Mobility Left Sidelying to Sit: 4: Min assist;With rails Left Sidelying to Sit Details (indicate cue type and reason): Patient with improved fluidity of movement with transition of trunk Sitting - Scoot to Edge of Bed: 4: Min assist Sitting - Scoot to Corder of Bed Details (indicate cue  type and reason): For reciprocal action Transfers Sit to Stand: From chair/3-in-1;With upper extremity assist;2: Max assist Sit to Stand Details (indicate cue type and reason): Patient with reluctance to transition trunk forward for initiation - needs verbal and tactile cues to achieve this task. Patient required stretching of achiles on left as on first stand patient only weight bearing on toes secondary to tightness. Post stretching patient able to bear weight through entire foot surface but both knees flexed and patient unable to extend. HR up to 140-150 with transfers and so requires rest to decr. pre-next attempt. X2. Patients HR decr. to early 100's within 10 seconds of rest. Stand to Sit: To chair/3-in-1;With upper extremity assist;3: Mod assist Stand to Sit Details: For control of descent Stand Pivot Transfers: 2: Max Actuary Details (indicate cue type and reason): Imporved clearance of arm rest today and able to take some pivotal steps to chair with maximal support for balance.  Static Sitting Balance Static Sitting - Balance Support: Bilateral upper extremity supported;Feet supported Static Sitting - Level of Assistance: 6: Modified independent (Device/Increase time) Static Standing Balance Static Standing - Balance Support: Bilateral upper extremity supported Static Standing - Level of Assistance: 2: Max assist Static Standing - Comment/# of Minutes: Limited by incr in HR so max. 10 second stand. Exercise  General Exercises - Lower Extremity Ankle Circles/Pumps: PROM;Left 3 x 30 second hold;AAROM;Right - patient with tendency to activate toe extensors and not dorsiflexors with movement so requires assistance for correct activation. Quad Sets: AROM;Both;5 reps;Supine Long Arc Quad: AROM;Both;10 reps;Seated Hip Flexion/Marching: AROM;Both;10 reps;Seated Toe Raises: AROM;Right;10 reps;Seated End of Session PT - End of Session Equipment Utilized During Treatment:  Gait belt Activity Tolerance: Treatment limited secondary  to medical complications (Comment) (HR 140-150 with activity) Patient left: in chair;with call bell in reach Nurse Communication: Mobility status for transfers General Behavior During Session: [facility] for tasks performed Cognition: [facility] for tasks performed  Edwyna Perfect, PT  Pager [phone]  08/08/2011, 9:39 AM

## 2011-08-09 LAB — GLUCOSE, CAPILLARY
Glucose-Capillary: 131 mg/dL — ABNORMAL HIGH (ref 70–99)
Glucose-Capillary: 94 mg/dL (ref 70–99)

## 2011-08-09 LAB — CBC
MCHC: 33.1 g/dL (ref 30.0–36.0)
Platelets: 174 10*3/uL (ref 150–400)
RDW: 13.8 % (ref 11.5–15.5)

## 2011-08-09 MED ORDER — METOPROLOL TARTRATE 12.5 MG HALF TABLET
12.5000 mg | ORAL_TABLET | Freq: Two times a day (BID) | ORAL | Status: DC
  Administered 2011-08-10 (×2): 12.5 mg via ORAL
  Filled 2011-08-09 (×5): qty 1

## 2011-08-09 MED ORDER — CLOTRIMAZOLE-BETAMETHASONE 1-0.05 % EX CREA
TOPICAL_CREAM | Freq: Two times a day (BID) | CUTANEOUS | Status: DC
  Administered 2011-08-09 – 2011-08-11 (×5): via TOPICAL
  Filled 2011-08-09: qty 15

## 2011-08-09 MED ORDER — SIMVASTATIN 40 MG PO TABS
40.0000 mg | ORAL_TABLET | Freq: Every day | ORAL | Status: DC
  Administered 2011-08-09 – 2011-08-10 (×2): 40 mg via ORAL
  Filled 2011-08-09 (×3): qty 1

## 2011-08-09 MED ORDER — POLYETHYLENE GLYCOL 3350 17 G PO PACK
17.0000 g | PACK | Freq: Two times a day (BID) | ORAL | Status: DC | PRN
  Filled 2011-08-09: qty 1

## 2011-08-09 NOTE — Progress Notes (Signed)
[facility] This is a 61 y.o. male with a past medical history significant for prior stroke and prior cerebral aneurysm repair. He has a new SDH.   No cardiac complaints. Sinus tachycardia has resolved. Subjective: No complaints.  Objective: Vital signs in last 24 hours: Temp:  [97.8 F (36.6 C)-98.3 F (36.8 C)] 97.8 F (36.6 C) (03/16 0503) Pulse Rate:  [60-88] 60  (03/16 0503) Resp:  [16-18] 18  (03/16 0503) BP: (92-117)/(51-64) 92/51 mmHg (03/16 0503) SpO2:  [96 %-100 %] 96 % (03/16 0503) Last BM Date: 08/08/11  Intake/Output from previous day: 03/15 0701 - 03/16 0700 In: 4217.1 [P.O.:1440; I.V.:2777.1] Out: 3775 [Urine:3775] Intake/Output this shift: Total I/O In: -  Out: 493 [Urine:493]  Medications Current Facility-Administered Medications  Medication Dose Route Frequency Provider Last Rate Last Dose  . acetaminophen (TYLENOL) tablet 650 mg  650 mg Oral Q4H PRN Andrena Mews, DO   650 mg at 08/07/11 0149   Or  . acetaminophen (TYLENOL) suppository 650 mg  650 mg Rectal Q4H PRN Andrena Mews, DO      . bismuth subsalicylate (PEPTO BISMOL) chewable tablet 524 mg  524 mg Oral PRN Andrena Mews, DO      . clotrimazole-betamethasone (LOTRISONE) cream   Topical BID Tobin Chad, MD      . ezetimibe (ZETIA) tablet 10 mg  10 mg Oral Daily Lennette Bihari, MD   10 mg at 08/09/11 1103  . feeding supplement (ENSURE CLINICAL STRENGTH) liquid 237 mL  237 mL Oral TID WC Hettie Holstein, RD   237 mL at 08/09/11 1107  . fluticasone (FLONASE) 50 MCG/ACT nasal spray 1 spray  1 spray Each Nare Daily Andrena Mews, DO   1 spray at 08/08/11 1610  . hydroxychloroquine (PLAQUENIL) tablet 400 mg  400 mg Oral Daily Andrena Mews, DO   400 mg at 08/09/11 1102  . labetalol (NORMODYNE,TRANDATE) injection 10-40 mg  10-40 mg Intravenous Q10 min PRN Andrena Mews, DO      . methotrexate (RHEUMATREX) tablet 5 mg  5 mg Oral Custom Andrena Mews, DO      . metoprolol tartrate (LOPRESSOR) tablet 37.5 mg  37.5 mg Oral BID Dwana Melena, PA   37.5 mg at 08/09/11 1102  . ondansetron (ZOFRAN) injection 4 mg  4 mg Intravenous Q6H PRN Andrena Mews, DO      . pantoprazole (PROTONIX) EC tablet 40 mg  40 mg Oral Q1200 Nestor Ramp, MD   40 mg at 08/09/11 1107  . PHENobarbital (LUMINAL) tablet 64.8 mg  64.8 mg Oral QHS Andrena Mews, DO   64.8 mg at 08/08/11 2153  . polyethylene glycol (MIRALAX / GLYCOLAX) packet 17 g  17 g Oral BID PRN Andrena Mews, DO      . senna-docusate (Senokot-S) tablet 1 tablet  1 tablet Oral BID Andrena Mews, DO   1 tablet at 08/09/11 1107  . Tamsulosin HCl (FLOMAX) capsule 0.4 mg  0.4 mg Oral QPC supper Andrena Mews, DO   0.4 mg at 08/08/11 1712  . DISCONTD: 0.9 %  sodium chloride infusion   Intravenous Continuous Andrena Mews, DO 125 mL/hr at 08/08/11 2200      PE: General appearance: alert, cooperative and no distress Lungs: clear to auscultation bilaterally Heart: regular rate and rhythm, S1, S2 normal, no murmur, click, rub or gallop Extremities: No LEE Pulses: 2+ and symmetric  Lab Results:   Basename 08/09/11 1135 08/08/11 0400 08/07/11 0127  WBC 2.6* 3.8* 5.9  HGB 11.8* 11.6* 14.0  HCT 35.6* 34.1* 41.4  PLT 174 185 245   BMET  Basename 08/08/11 0400 08/07/11 0127 08/06/11 1850  NA 137 140 142  K 4.0 4.1 4.8  CL 105 104 101  CO2 26 28 32  GLUCOSE 92 74 93  BUN 7 7 7   CREATININE 0.21* 0.22* 0.21*  CALCIUM 7.9* 8.3* 9.4   Assessment/Plan    Principal Problem:  *Subdural hematoma Active Problems:  HYPERLIPIDEMIA, not on statin secondary to polymyositis  HYPERTENSION, BENIGN SYSTEMIC  POLYMYOSITIS, weak, unsteady, falls  RHEUMATOID ARTHRITIS (NOT JUVENILE)  History of CVA  Troponin level elevated, one of 3 Troponin elevated at 0.4  Sinus tachycardia   Echo shows no evidence of structural heart abnormalities and there is no indication of a recent coronary  event.  Reactive sinus tachycardia, now resolved. Beta blockers are more or less "cosmetic" and should be gradually weaned off.  Spurious elevation in cTnI without any other findings to support coronary or other cardiac illness. No indication for further work up at this time.  Please reconsult if needed.   LOS: 3 days    Ilyse Tremain 08/09/2011 1:29 PM   Patient seen and examined. Agree with assessment and plan. No chest pain or SOB. Pt has a history of HTN and hyperlipidemia. Will further titrate beta-blocker as BP allows.  For 2d-echo to assess systolic and diastolic function. CK positive, Trop negative.  Consider trial of Zetia with LDL 155.   Lennette Bihari, MD, [facility] 08/09/2011 1:29 PM

## 2011-08-09 NOTE — Progress Notes (Signed)
Family Medicine Teaching Service Daily Resident Note   Patient name: [name] [name] Medical record number: [id number] Date of birth: [birth year] Age: 61 y.o. Gender: male Length of Stay:  LOS: 3 days             SUBJECTIVE & OVERNIGHT EVENTS  Patient reports feeling good.  Ate well.  Voiding well without difficulty.            OBJECTIVE  Vitals: Patient Vitals for the past 24 hrs:  BP Temp Temp src Pulse Resp SpO2  08/09/11 0503 92/51 mmHg 97.8 F (36.6 C) Oral 60  18  96 %  08/08/11 2243 117/64 mmHg 98.3 F (36.8 C) - 88  18  100 %  08/08/11 2000 111/57 mmHg 98.3 F (36.8 C) Oral 71  16  97 %  08/08/11 1551 96/63 mmHg 98.2 F (36.8 C) Oral - 17  100 %  08/08/11 1145 113/69 mmHg 98 F (36.7 C) Oral 67  15  100 %   Wt Readings from Last 3 Encounters:  08/08/11 115 lb 11.9 oz (52.5 kg)  07/30/10 122 lb (55.339 kg)  12/14/09 116 lb (52.617 kg)    Intake/Output Summary (Last 24 hours) at 08/09/11 0853 Last data filed at 08/09/11 0500  Gross per 24 hour  Intake 3602.08 ml  Output   3250 ml  Net 352.08 ml    PE: GENERAL:  Adult Caucasian male examined in [facility].  Appearance greater than age. PSYCH: Alert and appropriately interactive oriented X4; thought content appropriate HNEENT:  H&N: supple and trachea midline; well healed craniotomy scar over R temporal/frontal area OROPHARYNX: lips, mucosa, and tongue normal; teeth and gums normal EYES: conjunctivae/corneas clear. PERRL, EOM's intact. Fundi benign. NOSE: normal THORAX: HEART: RRR, S1/S2 heard, no murmur LUNGS: CTA B, no wheezes, no crackles ABDOMEN:  +BS soft non-tender EXTREMITIES: No edema; contracture of L achilles >NEURO: CN II - XII intact, lateralization of L side unchanged from prior exams; diffusely weakened  LABS: Hematology:  Lab 08/08/11 0400 08/07/11 0127 08/06/11 1850  WBC 3.8* 5.9 7.5  HGB 11.6* 14.0 15.4  HCT 34.1* 41.4 44.3  PLT 185 245 260  RDW 13.7 13.6 13.7  MCV 97.2 96.5 95.9  MCHC  34.0 33.8 34.8    Lab 08/06/11 1850  LYMPHSABS 1.0  EOSABS 0.0  BASOSABS 0.0   Metabolic:  Lab 08/08/11 0400 08/07/11 0127 08/06/11 1850  NA 137 140 142  K 4.0 4.1 4.8  CL 105 104 101  CO2 26 28 32  BUN 7 7 7   CREATININE 0.21* 0.22* 0.21*  GLUCOSE 92 74 93  CALCIUM 7.9* 8.3* 9.4  MG -- -- --  PHOS -- -- --    Lab 08/06/11 1850  AST 33  ALKPHOS 98  BILITOT 0.5  BILIDIR --  PROT 7.5  ALBUMIN 4.0  PREALBUMIN --    Other Labs:  Lab 08/07/11 1635 08/07/11 1231 08/07/11 0127  CKTOTAL 636* 600* 296*  TROPONINI <0.30 <0.30 <0.30  TROPONINT -- -- --  CKMBINDEX -- -- --    Vit-D: 12 Phenobarbital Level 11.4 3/6 - Direct LDL - 151 3/15 - ESR - 16 (WNL)  3/15 - Hemocult Pending  IMAGING: None New  PROCEDURES: 08/08/11 - 2D ECHO - EF 55-60% with Grade 1 diastolic dysfunction; mild mitral regurg, small pericardial effusion  MEDS: I have reviewed all medications and made changes as below.            ASSESSMENT & PLAN  61 year old  male with history of CVA as well as cerebral aneurysm status post clipping, presented status post fall found to have an acute subdural hematoma. Hospital Day #3  1. NEURO (Subdural Hematoma, S/p prior CVA with L hemiplegia, Cerebral Aneurysm s/p repair, Seizure Disorder) - Followed by Dr. Franky Macho (Neurosurgery) *Phenobarbital - No follow up required per Dr. Franky Macho - we appreciate his input - PT recommending In Pt Rehab - Rehab consult recommending SNF - insurance will not approve CIR at this time  - SW for SNF  2.  CV (Elevated CK-MB/CK, Mild Mitral Regurgitation, Pericardial Effusion (small), Sinus Tachycardia) - Followed by [facility] *Lopressor 37.5mg  - EF 55-60% with grade 1 diastolic - small pericardial effusion - mild mitral regurg  3. ENDO/METABOLIC (HLD, Vitamin D deficiency) - Will avoid statins but elevated LDL *[name] *Vit D 50,000 q week - d/c CBG checks - sugars well controlled  4. GU (Urinary  Retention) - poor urinary output following admission - cath with UOP > 400cc [ ]  Post void residual *Flomax 0.4mg  po qhs <<<  - remove foley today  5. GI (Consipation) - will give miralax bid X 1 day then bid prn   *Miralax 17g  [ ]  HEMOCULT  6. HEME (Relative Pancytopenia)  - will repeat CBC now - suspect dilutional vs lab error  7. MSK (RA, Myositis, R hip pain, L Ankle Spacisity ) - Will avoid statins due to hx - likely cause of elevated CK & CK-MB *Methotrexate *Plaquenil - no changes at this time  --- FEN (Protein-Calorie Malnutrition & Weight loss) *SL - Regular Diet * Ensure clinical strength - ESR WNL - hemocult pending - ?malignancy but less likely with ESR WNL [ ]  CMET and prealbumin in AM --- PPx: PPI - SCDs - NO CHEMICAL DVT prophylaxis 2o/2 bleed            DISPOSITION  Pending SNF placement on 3/18.  Clinically improving.    Andrena Mews, DO [facility] Resident - PGY-1 08/09/2011 8:53 AM

## 2011-08-09 NOTE — Progress Notes (Signed)
I interviewed and examined this patient and discussed the care plan with Dr. Madolyn Frieze and the [facility] team and agree with assessment and plan as documented in the progress note for today. I recommended [facility] for rehab since Dr Mauricio Po and I could see him there. Lotrisone was started for mild oomphalitis.     [name] [name] A. Sheffield Slider, MD Family Medicine Teaching Service Attending  08/09/2011 12:22 PM

## 2011-08-10 LAB — COMPREHENSIVE METABOLIC PANEL
AST: 27 U/L (ref 0–37)
Albumin: 2.7 g/dL — ABNORMAL LOW (ref 3.5–5.2)
Calcium: 8.2 mg/dL — ABNORMAL LOW (ref 8.4–10.5)
Creatinine, Ser: 0.23 mg/dL — ABNORMAL LOW (ref 0.50–1.35)

## 2011-08-10 LAB — CBC
Hemoglobin: 12.3 g/dL — ABNORMAL LOW (ref 13.0–17.0)
MCH: 32 pg (ref 26.0–34.0)
MCV: 95.8 fL (ref 78.0–100.0)
RBC: 3.84 MIL/uL — ABNORMAL LOW (ref 4.22–5.81)

## 2011-08-10 LAB — PREALBUMIN: Prealbumin: 15 mg/dL — ABNORMAL LOW (ref 17.0–34.0)

## 2011-08-10 LAB — GLUCOSE, CAPILLARY: Glucose-Capillary: 130 mg/dL — ABNORMAL HIGH (ref 70–99)

## 2011-08-10 MED ORDER — PHENOBARBITAL 32.4 MG PO TABS
64.8000 mg | ORAL_TABLET | Freq: Every day | ORAL | Status: DC
  Administered 2011-08-10 (×2): 64.8 mg via ORAL
  Filled 2011-08-10 (×2): qty 2

## 2011-08-10 NOTE — Progress Notes (Signed)
Family Medicine Teaching Service Daily Resident Note   Patient name: [name] [name] Medical record number: [id number] Date of birth: [birth year] Age: 61 y.o. Gender: male Length of Stay:  LOS: 4 days             SUBJECTIVE & OVERNIGHT EVENTS  Patient reports feeling good.  Ate well.  Voiding well without difficulty.            OBJECTIVE  Vitals: Patient Vitals for the past 24 hrs:  BP Temp Temp src Pulse Resp SpO2  08/10/11 0444 109/67 mmHg 98.2 F (36.8 C) Oral 74  18  100 %  08/09/11 2102 122/71 mmHg 98.3 F (36.8 C) Oral 76  18  99 %  08/09/11 1452 101/58 mmHg 98.3 F (36.8 C) Oral 62  18  99 %   Wt Readings from Last 3 Encounters:  08/08/11 115 lb 11.9 oz (52.5 kg)  07/30/10 122 lb (55.339 kg)  12/14/09 116 lb (52.617 kg)    Intake/Output Summary (Last 24 hours) at 08/10/11 1018 Last data filed at 08/09/11 2300  Gross per 24 hour  Intake    240 ml  Output   1418 ml  Net  -1178 ml    PE: GENERAL:  Adult Caucasian male examined in [facility] 5000.  Appearance greater than age. PSYCH: Alert and appropriately interactive oriented X4; thought content appropriate HNEENT:  H&N: supple and trachea midline; well healed craniotomy scar over R temporal/frontal area OROPHARYNX: lips, mucosa, and tongue normal; teeth and gums normal EYES: conjunctivae/corneas clear. PERRL, EOM's intact. Fundi benign. NOSE: normal THORAX: HEART: RRR, S1/S2 heard, no murmur LUNGS: CTA B, no wheezes, no crackles ABDOMEN:  +BS soft non-tender EXTREMITIES: No edema; contracture of L achilles >NEURO: CN II - XII intact, lateralization of L side unchanged from prior exams; diffusely weakened  LABS: Hematology:  Lab 08/10/11 0627 08/09/11 1135 08/08/11 0400  WBC 4.5 2.6* 3.8*  HGB 12.3* 11.8* 11.6*  HCT 36.8* 35.6* 34.1*  PLT 178 174 185  RDW 13.5 13.8 13.7  MCV 95.8 97.0 97.2  MCHC 33.4 33.1 34.0    Lab 08/06/11 1850  LYMPHSABS 1.0  EOSABS 0.0  BASOSABS 0.0   Metabolic:  Lab 08/10/11 0627  40/98/11 0400 08/07/11 0127  NA 139 137 140  K 4.2 4.0 4.1  CL 103 105 104  CO2 29 26 28   BUN 12 7 7   CREATININE 0.23* 0.21* 0.22*  GLUCOSE 86 92 74  CALCIUM 8.2* 7.9* 8.3*  MG -- -- --  PHOS -- -- --    Lab 08/10/11 0627 08/06/11 1850  AST 27 33  ALKPHOS 65 98  BILITOT 0.2* 0.5  BILIDIR -- --  PROT 5.6* 7.5  ALBUMIN 2.7* 4.0  PREALBUMIN -- --    Other Labs:  Lab 08/07/11 1635 08/07/11 1231 08/07/11 0127  CKTOTAL 636* 600* 296*  TROPONINI <0.30 <0.30 <0.30  TROPONINT -- -- --  CKMBINDEX -- -- --    Vit-D: 12 Phenobarbital Level 11.4 3/6 - Direct LDL - 151 3/15 - ESR - 16 (WNL)  3/15 - Hemocult Pending  IMAGING: None New  PROCEDURES: 08/08/11 - 2D ECHO - EF 55-60% with Grade 1 diastolic dysfunction; mild mitral regurg, small pericardial effusion  MEDS: I have reviewed all medications and made changes as below.            ASSESSMENT & PLAN  61 year old male with history of CVA as well as cerebral aneurysm status post clipping, presented status post fall found  to have an acute subdural hematoma. Hospital Day #4  1. NEURO (Subdural Hematoma, S/p prior CVA with L hemiplegia, Cerebral Aneurysm s/p repair, Seizure Disorder) - Followed by Dr. Franky Macho (Neurosurgery) *Phenobarbital - No follow up required per Dr. Franky Macho - we appreciate his input - PT recommending In Pt Rehab - Rehab consult recommending SNF - insurance will not approve CIR at this time  - SW for SNF  2.  CV (Elevated CK-MB/CK, Mild Mitral Regurgitation, Pericardial Effusion (small), Sinus Tachycardia) - Followed by [facility] *Lopressor 37.5mg  - EF 55-60% with grade 1 diastolic - small pericardial effusion - mild mitral regurg  3. ENDO/METABOLIC (HLD, Vitamin D deficiency) - Will avoid statins but elevated LDL *[name] *Vit D 50,000 q week - d/c CBG checks - sugars well controlled  4. GU (Urinary Retention) - poor urinary output following admission - cath with UOP > 400cc [  ] Post void residual *Flomax 0.4mg  po qhs <<<  - remove foley today  5. GI (Consipation) - will give miralax bid X 1 day then bid prn   *Miralax 17g  [ ]  HEMOCULT  6. HEME (Relative Pancytopenia)  - will repeat CBC now - suspect dilutional vs lab error  7. MSK (RA, Myositis, R hip pain, L Ankle Spacisity ) - Will avoid statins due to hx - likely cause of elevated CK & CK-MB *Methotrexate *Plaquenil - no changes at this time  --- FEN (Protein-Calorie Malnutrition & Weight loss) *SL - Regular Diet * Ensure clinical strength - ESR WNL - hemocult pending - ?malignancy but less likely with ESR WNL [ ]  CMET and prealbumin in AM --- PPx: PPI - SCDs - NO CHEMICAL DVT prophylaxis 2o/2 bleed            DISPOSITION  Pending SNF placement on 3/18.  Clinically improving.    Edd Arbour, MD [facility] Resident - PGY-3 08/10/2011 10:18 AM

## 2011-08-10 NOTE — Progress Notes (Signed)
I interviewed and examined this patient and discussed the care plan with Dr. Rivka Safer and the [facility] team and agree with assessment and plan as documented in the progress note for today.    Alanmichael Barmore A. Sheffield Slider, MD Family Medicine Teaching Service Attending  08/10/2011 6:13 PM

## 2011-08-11 LAB — GLUCOSE, CAPILLARY: Glucose-Capillary: 135 mg/dL — ABNORMAL HIGH (ref 70–99)

## 2011-08-11 MED ORDER — EZETIMIBE 10 MG PO TABS
10.0000 mg | ORAL_TABLET | Freq: Every day | ORAL | Status: DC
  Administered 2011-08-11: 10 mg via ORAL
  Filled 2011-08-11: qty 1

## 2011-08-11 MED ORDER — MOMETASONE FUROATE 50 MCG/ACT NA SUSP: 2.0000 | Freq: Every day | NASAL | Status: DC

## 2011-08-11 MED ORDER — POLYETHYLENE GLYCOL 3350 17 G PO PACK: 17.0000 g | PACK | Freq: Two times a day (BID) | ORAL | Status: AC | PRN

## 2011-08-11 MED ORDER — EZETIMIBE 10 MG PO TABS: 10.0000 mg | ORAL_TABLET | Freq: Every day | ORAL | Status: DC

## 2011-08-11 MED ORDER — CLOTRIMAZOLE-BETAMETHASONE 1-0.05 % EX CREA: TOPICAL_CREAM | Freq: Two times a day (BID) | CUTANEOUS | Status: DC

## 2011-08-11 MED ORDER — ACETAMINOPHEN 325 MG PO TABS: 650.0000 mg | ORAL_TABLET | Freq: Four times a day (QID) | ORAL | Status: AC | PRN

## 2011-08-11 MED ORDER — TAMSULOSIN HCL 0.4 MG PO CAPS: 0.4000 mg | ORAL_CAPSULE | Freq: Every day | ORAL | Status: DC

## 2011-08-11 MED ORDER — BISMUTH SUBSALICYLATE 262 MG PO CHEW: 524.0000 mg | CHEWABLE_TABLET | ORAL | Status: AC | PRN

## 2011-08-11 MED ORDER — ENSURE CLINICAL ST REVIGOR PO LIQD: 237.0000 mL | Freq: Three times a day (TID) | ORAL | Status: DC

## 2011-08-11 MED ORDER — METOPROLOL TARTRATE 12.5 MG HALF TABLET: 12.5000 mg | ORAL_TABLET | Freq: Two times a day (BID) | ORAL | Status: DC

## 2011-08-11 MED ORDER — DICLOFENAC SODIUM 1 % TD GEL: 1.0000 "application " | Freq: Two times a day (BID) | TRANSDERMAL | Status: DC | PRN

## 2011-08-11 MED ORDER — B COMPLEX VITAMINS PO CAPS: 1.0000 | ORAL_CAPSULE | Freq: Every day | ORAL | Status: DC

## 2011-08-11 NOTE — Progress Notes (Signed)
Clinical Social Work Department CLINICAL SOCIAL WORK PLACEMENT NOTE 08/11/2011  Patient:  [name] [name], [name] [name]  Account Number:  [id number] Admit date:  08/06/2011  Clinical Social Worker:  Genelle Bal, LCSW  Date/time:  08/08/2011 03:59 PM  Clinical Social Work is seeking post-discharge placement for this patient at the following level of care:   [facility]   (*CSW will update this form in Epic as items are completed)   08/08/2011  Patient/family provided with [facility] Department of Clinical Social Work's list of facilities offering this level of care within the geographic area requested by the patient (or if unable, by the patient's family).  08/08/2011  Patient/family informed of their freedom to choose among providers that offer the needed level of care, that participate in Medicare, Medicaid or managed care program needed by the patient, have an available bed and are willing to accept the patient.    Patient/family informed of [facility]' ownership interest in [facility], as well as of the fact that they are under no obligation to receive care at this facility.  PASARR submitted to EDS on 08/08/2011 PASARR number received from [employer] on 08/08/11  FL2 transmitted to all facilities in geographic area requested by pt/family on  08/08/2011 FL2 transmitted to all facilities within larger geographic area on   Patient informed that his/her managed care company has contracts with or will negotiate with  certain facilities, including the following:   Clinical Information faxed to  [facility] 08/08/11     Patient/family informed of bed offers received:  08/11/2011 Patient chooses bed at [facility] Physician recommends and patient chooses bed at    Patient to be transferred to [facility] on  08/11/2011 Patient to be transferred to facility by Ambulance  The following physician request were entered in Epic:   Additional Comments:  08/11/11 - UPDATED DISCHARGE PLACEMENT NOTE [facility] Medicare Ambulance Authorization - #[id number]

## 2011-08-11 NOTE — Discharge Summary (Signed)
Family Medicine Teaching Service DISCHARGE SUMMARY   Patient name: [name] [name] Medical record number: [id number] Date of birth: 09/11/1950 Age: 61 y.o. Gender: male  Attending Physician: Nestor Ramp, MD Primary Care Provider: Barbaraann Barthel, MD, MD Consultants: Neurosurgery  Dates of Hospitalization:  08/06/2011 to 08/11/2011 Length of Stay: 5 days  Admission Diagnoses:  Subdural Hematoma s/p fall  Discharge Diagnoses:  Principal Problem:  *Subdural hematoma Active Problems:  HYPERLIPIDEMIA, not on statin secondary to polymyositis  HYPERTENSION, BENIGN SYSTEMIC  POLYMYOSITIS, weak, unsteady, falls  RHEUMATOID ARTHRITIS (NOT JUVENILE)  History of CVA  Troponin level elevated, one of 3 Troponin elevated at 0.4  Sinus tachycardia  Patient Active Problem List  Diagnoses Date Noted  . Subdural hematoma 08/07/2011  . Troponin level elevated, one of 3 Troponin elevated at 0.4 08/07/2011  . Sinus tachycardia 08/07/2011  . RA (rheumatoid arthritis)   . Dermatitis 07/30/2010  . POLYMYOSITIS, weak, unsteady, falls 12/14/2009  . VERTEBRAL FRACTURE 08/24/2008  . HYPERLIPIDEMIA, not on statin secondary to polymyositis 08/06/2006  . HYPERTENSION, BENIGN SYSTEMIC 07/23/2006  . RHEUMATOID ARTHRITIS (NOT JUVENILE) 07/23/2006  . FIBROMYALGIA, FIBROMYOSITIS 07/23/2006  . History of CVA 07/23/2006  . CONVULSIONS, SEIZURES, NOS 07/23/2006  . INCONTINENCE, URGE 07/23/2006    Brief Hospital Course   [name] [name] is a 61 y.o. year old male who presented following a fall.  He is status post cerebral aneurysm clipping in 1996 and subdural hematoma in 1997.  He reportedly had worsening of his chronic left hemiplegia and that he had fallen 3 times in the past 48hours.  Pt reported prior to evaluation in the ED he had fallen and hit the top/back of his head.  His nephew "picked him up" and brought him to the ED.  He was found to have a subdural hematoma.  Neurosurgery was consulted and reviewed his  imaging and deferred admission to FMTS as this was a non-surgical bleed and he demonstrated no new focal neurologic defecits and had no mid line shift on CT.  The patient was admitted to the SDU with frequent Neuro checks.  His hosiptal course was uncomplicated and he showed steady progression throughout his stay.  The remainder of the hospital course is as follows:  1. NEURO (Subdural Hematoma, S/p prior CVA with L hemiplegia, Cerebral Aneurysm s/p repair, Seizure Disorder) - Followed by Dr. Franky Macho (Neurosurgery) - Non surgical bleed at this time *Phenobarbital  - No follow up required per Dr. Franky Macho - we appreciate his input  - PT recommended In Pt Rehab - Rehab consult recommending SNF - insurance will not approve CIR at this time  - Plan to SNF at D/c - for short term rehab vs long term placement  2. CV (Elevated CK-MB/CK, Mild Mitral Regurgitation, Pericardial Effusion (small), Sinus Tachycardia) - Followed by [facility] *Lopressor 12.5 mg bid - EF 55-60% with grade 1 diastolic  - small pericardial effusion  - mild mitral regurg    3. ENDO/METABOLIC (HLD, Vitamin D deficiency) - . *[name]  *Vit D 50,000 q week  - d/c CBG checks - sugars well controlled  - LDL 151 - Cardiology stopped his statin due to prior hx of myosisits in setting of elevated CK and CK-MB.  Consider Red yeast rice as an OP.   - Vit D 12 - Will need Vit D 50,000qweek X 8 weeks and recheck  4. GU (Urinary Retention) - poor urinary output following admission - cath with UOP > 400cc - Urinary  retention at time of admission - foley catheter X3 days -  stopped home bethanachol and started flomax.   - Post void residual cath = 93mL *Flomax 0.4mg  po qhs <<< stopped home bethanachol  5. GI (Consipation) - will give miralax bid X 1 day then bid prn *Miralax 17g bid prn for constipation   - hemoccult negative  6. HEME (Leukopenia and anemia) - Transient improved prior to d/c.    7. MSK (RA, Myositis,  R hip pain, L Ankle Spacisity ) - Will avoid statins due to hx - likely cause of elevated CK & CK-MB *Methotrexate  *Plaquenil  - no changes at this time   --- FEN (Protein-Calorie Malnutrition & Weight loss)  - Regular Diet  * Ensure clinical strength  - ESR WNL - hemocult pending - ?malignancy but less likely with ESR WNL and Hemmocult negative X1.  Consider serial hemocults.   - Prealbumin 15.0 prior to d/c --- PPx: PPI - SCDs - NO CHEMICAL DVT prophylaxis 2o/2 bleed > PPI stopped at time of d/c    Significant Diagnostics:  LABS: Hematology:  Lab 08/10/11 0627 08/09/11 1135 08/08/11 0400  WBC 4.5 2.6* 3.8*  HGB 12.3* 11.8* 11.6*  HCT 36.8* 35.6* 34.1*  PLT 178 174 185  RDW 13.5 13.8 13.7  MCV 95.8 97.0 97.2  MCHC 33.4 33.1 34.0    Lab 08/06/11 1850  LYMPHSABS 1.0  EOSABS 0.0  BASOSABS 0.0   Metabolic:  Lab 08/10/11 0627 08/08/11 0400 08/07/11 0127  NA 139 137 140  K 4.2 4.0 4.1  CL 103 105 104  CO2 29 26 28   BUN 12 7 7   CREATININE 0.23* 0.21* 0.22*  GLUCOSE 86 92 74  CALCIUM 8.2* 7.9* 8.3*  MG -- -- --  PHOS -- -- --    Lab 08/10/11 0627 08/06/11 1850  AST 27 33  ALKPHOS 65 98  BILITOT 0.2* 0.5  BILIDIR -- --  PROT 5.6* 7.5  ALBUMIN 2.7* 4.0  PREALBUMIN 15.0* --    Lab 08/11/11 1042 08/11/11 0636 08/10/11 2122 08/10/11 1645 08/10/11 0621 08/09/11 2100 08/09/11 1642 08/09/11 1220 08/08/11 2145 08/08/11 1737  GLUCAP 135* 87 94 130* 94 94 107* 131* 106* 122*   No results found for this basename: HGBA1C:3 in the last 168 hours Other Labs:  Lab 08/07/11 1635 08/07/11 1231 08/07/11 0127  CKTOTAL 636* 600* 296*  TROPONINI <0.30 <0.30 <0.30  TROPONINT -- -- --  CKMBINDEX -- -- --   Vit-D: 12  Phenobarbital Level 11.4  3/6 - Direct LDL - 151  3/15 - ESR - 16 (WNL)  3/15 - Hemocult Negative  MICRO None  IMAGING: 3/13 - CT Head: 1. New left temporal and parietal subdural hematoma with minimal mass effect. 2. Stable postoperative changes and  right frontal encephalomalacia. 3/13 XRay Knee 4 view: Osteopenia without fracture or other acute abnormality.  3/13 - CXR: No acute disease  3/14 - Xray R Hip: No acute abnormality.  Procedures:   08/08/11 - 2D ECHO - EF 55-60% with Grade 1 diastolic dysfunction; mild mitral regurg, small pericardial effusion   Discharge Vitals and Exam:  Vitals: Patient Vitals for the past 24 hrs:  BP Temp Temp src Pulse Resp SpO2  08/11/11 0639 109/67 mmHg 97.5 F (36.4 C) Oral 75  18  97 %  08/10/11 2259 95/52 mmHg - - 80  - -  08/10/11 2125 81/57 mmHg 99.1 F (37.3 C) Oral 69  20  94 %  08/10/11 1502  97/51 mmHg 98.5 F (36.9 C) Oral 86  18  98 %   Wt Readings from Last 3 Encounters:  08/08/11 115 lb 11.9 oz (52.5 kg)  07/30/10 122 lb (55.339 kg)  12/14/09 116 lb (52.617 kg)    Intake/Output Summary (Last 24 hours) at 08/11/11 1204 Last data filed at 08/11/11 0900  Gross per 24 hour  Intake    660 ml  Output   1275 ml  Net   -615 ml    PE: GENERAL: Adult Caucasian male examined in [facility] 5000. Appearance greater than age.  PSYCH: Alert and appropriately interactive oriented X4; thought content appropriate  HNEENT:  H&N: supple and trachea midline; well healed craniotomy scar over R temporal/frontal area  OROPHARYNX: lips, mucosa, and tongue normal; teeth and gums normal  EYES: conjunctivae/corneas clear. PERRL, EOM's intact. Fundi benign.  NOSE: normal  THORAX:  HEART: RRR, S1/S2 heard, no murmur  LUNGS: CTA B, no wheezes, no crackles  ABDOMEN: +BS soft non-tender  EXTREMITIES: No edema; contracture of L achilles  >NEURO: CN II - XII intact, lateralization of L side unchanged from prior exams; diffusely weakened       Discharge Information   Disposition:  Discharge Diet: Resume diet Discharge Condition:  Improved Discharge Activity: Per PT recommendations Medication List  As of 08/11/2011 12:04 PM   STOP taking these medications         celecoxib 200 MG capsule       cholecalciferol 1000 UNITS tablet      hydroxychloroquine 200 MG tablet      OVER THE COUNTER MEDICATION      oxybutynin 5 MG 24 hr tablet      simvastatin 20 MG tablet      UNABLE TO FIND         TAKE these medications         acetaminophen 325 MG tablet   Commonly known as: TYLENOL   Take 2 tablets (650 mg total) by mouth every 6 (six) hours as needed for pain or fever (or temp > 99 F).      b complex vitamins capsule   Take 1 capsule by mouth daily.      bismuth subsalicylate 262 MG chewable tablet   Commonly known as: PEPTO BISMOL   Chew 2 tablets (524 mg total) by mouth as needed. For upset stomach      clotrimazole-betamethasone cream   Commonly known as: LOTRISONE   Apply topically 2 (two) times daily.      diclofenac sodium 1 % Gel   Commonly known as: VOLTAREN   Apply 1 application topically 2 (two) times daily as needed. Apply to knees as needed      ezetimibe 10 MG tablet   Commonly known as: ZETIA   Take 1 tablet (10 mg total) by mouth daily.      feeding supplement Liqd   Take 237 mLs by mouth 3 (three) times daily with meals.      methotrexate 2.5 MG tablet   Commonly known as: RHEUMATREX   Take 12.5 mg by mouth 2 (two) times a week. Saturday and Sunday. 5 tablets  Caution:Chemotherapy. Protect from light.      metoprolol tartrate 12.5 mg Tabs   Commonly known as: LOPRESSOR   Take 0.5 tablets (12.5 mg total) by mouth 2 (two) times daily.      mometasone 50 MCG/ACT nasal spray   Commonly known as: NASONEX   Place 2 sprays into the nose daily.  PHENobarbital 64.8 MG tablet   Commonly known as: LUMINAL   Take 64.8 mg by mouth at bedtime.      PLAQUENIL 200 MG tablet   Generic drug: hydroxychloroquine   Take 200 mg by mouth 2 (two) times daily.      polyethylene glycol packet   Commonly known as: MIRALAX / GLYCOLAX   Take 17 g by mouth 2 (two) times daily as needed.      Tamsulosin HCl 0.4 MG Caps   Commonly known as: FLOMAX   Take 1  capsule (0.4 mg total) by mouth daily after supper.      VITAMIN D PO   Take 1,200 mg by mouth daily.            Follow Up Issues and Recommendations:    Discharge Orders    Future Orders Please Complete By Expires   Diet - low sodium heart healthy      Increase activity slowly        Follow-up Information    Follow up with Barbaraann Barthel, MD .        Pending Results: none   Issues and Medications to address: Vitamin D - q weekly dosing - recheck in 8 weeks Bladder outlet obstruction - started on flomax - stopped bethanachol Lipids - recheck in 3 months - stopped statin due to myositis. On Zeitia - consider red yeast rice as OP as typically tolerated better than statin Nutrition - pt continuing to lose weight.  Consider occult malignancy - serial hemoccult - Neg X 1 here with normal ESR but reported 70+ wt loss in past 1 year.   Andrena Mews, DO [facility] Resident - PGY-1 08/11/2011 12:04 PM

## 2011-08-11 NOTE — Progress Notes (Signed)
Physical Therapy Treatment Patient Details Name: [name] [name] MRN: [id number] DOB: 1950/12/21 Today's Date: 08/11/2011  PT Assessment/Plan  PT - Assessment/Plan Comments on Treatment Session: Making slow but steady progress PT Plan: Discharge plan remains appropriate PT Frequency: Min 4X/week Follow Up Recommendations: Skilled nursing facility;Inpatient Rehab Equipment Recommended: Defer to next venue PT Goals  Acute Rehab PT Goals Time For Goal Achievement: 2 weeks Pt will go Sit to Supine/Side: with supervision PT Goal: Sit to Supine/Side - Progress: Progressing toward goal Pt will go Sit to Stand: with min assist;with upper extremity assist PT Goal: Sit to Stand - Progress: Progressing toward goal Pt will go Stand to Sit: with min assist;with upper extremity assist PT Goal: Stand to Sit - Progress: Progressing toward goal Pt will Transfer Bed to Chair/Chair to Bed: with min assist PT Transfer Goal: Bed to Chair/Chair to Bed - Progress: Progressing toward goal Pt will Ambulate: 16 - 50 feet;with least restrictive assistive device;with mod assist PT Goal: Ambulate - Progress: Progressing toward goal  PT Treatment Precautions/Restrictions  Precautions Precautions: Fall Precaution Comments: Bilateral LE weakness Required Braces or Orthoses: No Restrictions Weight Bearing Restrictions: No Mobility (including Balance) Bed Mobility Sit to Supine: 4: Min assist (guard assist witout physical contact) Sit to Supine - Details (indicate cue type and reason): cues for safe technique Transfers Sit to Stand: 2: Max assist;With armrests;From chair/3-in-1 Sit to Stand Details (indicate cue type and reason): continued weakness limting success with sit-stand; heavy dependence on UE push/support on armrests or RW Stand to Sit: 2: Max assist;To chair/3-in-1 Stand to Sit Details: continued decr control of descent Stand Pivot Transfers: 2: Max Actuary Details (indicate cue  type and reason): Used RW for transfer recliner to [facility] Fall; continues to be dependent on UE support Ambulation/Gait Ambulation/Gait: Yes Ambulation/Gait Assistance: 1: +2 Total assist;Patient percentage (comment) (pt=70%) Ambulation/Gait Assistance Details (indicate cue type and reason): second person for safety as pt continues to have significant weakness Ambulation Distance (Feet): 5 Feet (recliner to bed) Assistive device: Rolling walker Gait Pattern:  (short step length and height)       End of Session General Behavior During Session: [name] [name] for tasks performed Cognition: [name] [name] for tasks performed  [name] [name], [name] [name] [phone]  08/11/2011, 5:34 PM

## 2011-08-11 NOTE — Progress Notes (Signed)
Occupational Therapy Treatment Patient Details Name: BOAZ BERISHA MRN: [id number] DOB: 1951-03-19 Today's Date: 08/11/2011 8:47-9:18  2sc OT Assessment/Plan OT Assessment/Plan Comments on Treatment Session: Pt improving slowly.  Still with significant bilateral LE weakness and decreased dynamic and static balance.  Pt with improved ability to cross his LEs using his UEs for LB selfcare.   OT Plan: Discharge plan remains appropriate OT Frequency: Min 2X/week Follow Up Recommendations: Skilled nursing facility OT Goals ADL Goals ADL Goal: Upper Body Bathing - Progress: Met ADL Goal: Lower Body Bathing - Progress: Progressing toward goals ADL Goal: Lower Body Dressing - Progress: Progressing toward goals ADL Goal: Toilet Transfer - Progress: Progressing toward goals  OT Treatment Precautions/Restrictions  Precautions Precautions: Fall Precaution Comments: Bilateral LE weakness Required Braces or Orthoses: No Restrictions Weight Bearing Restrictions: No   ADL ADL Grooming: Supervision/safety;Performed Where Assessed - Grooming: Sitting, bed;Unsupported Upper Body Bathing: Performed;Set up;Chest;Right arm;Left arm;Abdomen Where Assessed - Upper Body Bathing: Unsupported;Sitting, bed Lower Body Bathing: Moderate assistance Where Assessed - Lower Body Bathing: Sit to stand from bed Upper Body Dressing: Set up;Performed [facility] gown only) Where Assessed - Upper Body Dressing: Unsupported;Sitting, bed Lower Body Dressing: Performed;Set up Lower Body Dressing Details (indicate cue type and reason): Pt only donned gripper socks for LB dressing. Where Assessed - Lower Body Dressing: Sit to stand from bed Toilet Transfer: Simulated;Moderate assistance Toilet Transfer Details (indicate cue type and reason): Pt used RW to ambulate to bedside chair. Toilet Transfer Method: Ambulating Equipment Used: Rolling walker Ambulation Related to ADLs: Pt able to utilize RW for short mobility in the  room to transfer to the bedside chair.   ADL Comments: Pt needs mod assist to perform sit to stand from  the edge of the bed.  Able to cross LEs to day for donning and doffing gripper socks.  Needed use of his UEs to help with crossing however. Mobility  Bed Mobility Bed Mobility: Yes Rolling Left: 5: Supervision;With rail Left Sidelying to Sit: 4: Min assist;HOB flat;With rails Transfers Transfers: Yes Sit to Stand: 4: Min assist Stand to Sit: 2: Max assist;To chair/3-in-1 Stand to Sit Details: Pt collapses down in the chair.  Does not control descent.  End of Session OT - End of Session Equipment Utilized During Treatment: Gait belt Activity Tolerance: Patient tolerated treatment well Patient left: in chair;with call bell in reach General Behavior During Session: [facility] for tasks performed Cognition: [facility] for tasks performed  Adalei Novell OTR/L 08/11/2011, 11:11 AM Pager number [phone]

## 2011-08-11 NOTE — Discharge Summary (Signed)
I discussed with Dr Rigby.  I agree with their plans documented in their  Note for today.  

## 2011-08-12 ENCOUNTER — Encounter: Payer: Self-pay | Admitting: Family Medicine

## 2011-08-12 ENCOUNTER — Non-Acute Institutional Stay: Payer: Self-pay | Admitting: Family Medicine

## 2011-08-12 NOTE — Progress Notes (Signed)
[facility] Nursing Home Admission History and Physical  Patient name: [name] [name] Medical record number: [id number] Date of birth: 01/19/1951 Age: 61 y.o. Gender: male  Primary Care Provider: Barbaraann Barthel, MD, MD  Chief Complaint: subdural hematoma, protein calorie malnutrition, decreased functional status.  History of Present Illness: [name] [name] is a 61 y.o. year old male presenting from the hospital after being discharged on 3/18.  He presented following a fall. He is status post cerebral aneurysm clipping in 1996 and subdural hematoma in 1997. He reportedly had worsening of his chronic left hemiplegia and that he had fallen 3 times in the past 48hours prior to admission. Pt reported prior to evaluation in the ED he had fallen and hit the top/back of his head. His nephew "picked him up" and brought him to the ED. He was found to have a subdural hematoma. Neurosurgery was consulted and reviewed his imaging and deferred admission to FMTS as this was a non-surgical bleed and he demonstrated no new focal neurologic defecits and had no mid line shift on CT. The patient was admitted to the SDU with frequent Neuro checks. His hosiptal course was uncomplicated and he showed steady progression throughout his stay.  1. NEURO (Subdural Hematoma, S/p prior CVA with L hemiplegia, Cerebral Aneurysm s/p repair, Seizure Disorder) - Followed by Dr. Franky Macho (Neurosurgery) - Non surgical bleed at this time *Phenobarbital  - No follow up required per Dr. Franky Macho - we appreciate his input  - PT recommended In Pt Rehab - Rehab consult recommending SNF - insurance will not approve CIR at this time  - Plan to SNF at D/c - for short term rehab vs long term placement  2. CV (Elevated CK-MB/CK, Mild Mitral Regurgitation, Pericardial Effusion (small), Sinus Tachycardia) - Followed by [facility] *Lopressor 12.5 mg bid  - EF 55-60% with grade 1 diastolic  - small pericardial effusion  - mild mitral regurg    3. ENDO/METABOLIC (HLD, Vitamin D deficiency) - . *[name]  *Vit D 50,000 q week  - d/c CBG checks - sugars well controlled  - LDL 151 - Cardiology stopped his statin due to prior hx of myosisits in setting of elevated CK and CK-MB. Consider Red yeast rice as an OP.  - Vit D 12 - Will need Vit D 50,000qweek X 8 weeks and recheck  4. GU (Urinary Retention) - poor urinary output following admission - cath with UOP > 400cc - Urinary retention at time of admission - foley catheter X3 days - stopped home bethanachol and started flomax.  - Post void residual cath = 93mL  *Flomax 0.4mg  po qhs <<< stopped home bethanachol  5. GI (Consipation) - will give miralax bid X 1 day then bid prn *Miralax 17g bid prn for constipation  - hemoccult negative  6. HEME (Leukopenia and anemia) - Transient improved prior to d/c.  7. MSK (RA, Myositis, R hip pain, L Ankle Spacisity ) - Will avoid statins due to hx - likely cause of elevated CK & CK-MB *Methotrexate  *Plaquenil  - no changes at this time  --- FEN (Protein-Calorie Malnutrition & Weight loss)  - Regular Diet  * Ensure clinical strength  - ESR WNL - hemocult pending - ?malignancy but less likely with ESR WNL and Hemmocult negative X1. Consider serial hemocults.  - Prealbumin 15.0 prior to d/c  --- PPx: PPI - SCDs - NO CHEMICAL DVT prophylaxis 2o/2 bleed > PPI stopped at time of d/c   Patient Active  Problem List  Diagnoses  . 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 HYPERTENSION, BENIGN SYSTEMIC  . POLYMYOSITIS, weak, unsteady, falls  . RHEUMATOID ARTHRITIS (NOT JUVENILE)  . FIBROMYALGIA, FIBROMYOSITIS  . History of CVA  . CONVULSIONS, SEIZURES, NOS  . INCONTINENCE, URGE  . VERTEBRAL FRACTURE  . Dermatitis  . Subdural hematoma  . Troponin level elevated, one of 3 Troponin elevated at 0.4  . Sinus tachycardia  . RA (rheumatoid arthritis)   Past Medical History: Past Medical History  Diagnosis Date  . Hypertension   .  Arthritis   . Stroke   . SDH (subdural hematoma)   . Seizure disorder   . RA (rheumatoid arthritis)     Past Surgical History: Past Surgical History  Procedure Date  . Appendectomy   . Cerebral aneurysm repair     with left hemiparesis.  . Subdural hematoma evacuation via craniotomy     Social History: History   Social History  . Marital Status: Single    Spouse Name: N/A    Number of Children: N/A  . Years of Education: N/A   Social History Main Topics  . Smoking status: Former Smoker -- 25 years    Types: Cigarettes  . Smokeless tobacco: None  . Alcohol Use: No  . Drug Use: No  . Sexually Active: No   Other Topics Concern  . None   Social History Narrative  . None    Family History: No family history on file.  Allergies: No Known Allergies  Current Outpatient Prescriptions  Medication Sig Dispense Refill  . acetaminophen (TYLENOL) 325 MG tablet Take 2 tablets (650 mg total) by mouth every 6 (six) hours as needed for pain or fever (or temp > 99 F).      Marland Kitchen b complex vitamins capsule Take 1 capsule by mouth daily.      Marland Kitchen bismuth subsalicylate (PEPTO BISMOL) 262 MG chewable tablet Chew 2 tablets (524 mg total) by mouth as needed. For upset stomach  30 tablet    . Cholecalciferol (VITAMIN D PO) Take 1,200 mg by mouth daily.      . clotrimazole-betamethasone (LOTRISONE) cream Apply topically 2 (two) times daily.  30 g    . diclofenac sodium (VOLTAREN) 1 % GEL Apply 1 application topically 2 (two) times daily as needed. Apply to knees as needed      . ezetimibe (ZETIA) 10 MG tablet Take 1 tablet (10 mg total) by mouth daily.      . feeding supplement (ENSURE CLINICAL STRENGTH) LIQD Take 237 mLs by mouth 3 (three) times daily with meals.      . hydroxychloroquine (PLAQUENIL) 200 MG tablet Take 200 mg by mouth 2 (two) times daily.      . methotrexate (RHEUMATREX) 2.5 MG tablet Take 12.5 mg by mouth 2 (two) times a week. Saturday and Sunday. 5 tablets   Caution:Chemotherapy. Protect from light.      . metoprolol tartrate (LOPRESSOR) 12.5 mg TABS Take 0.5 tablets (12.5 mg total) by mouth 2 (two) times daily.      . mometasone (NASONEX) 50 MCG/ACT nasal spray Place 2 sprays into the nose daily.  17 g    . PHENobarbital (LUMINAL) 64.8 MG tablet Take 64.8 mg by mouth at bedtime.       . polyethylene glycol (MIRALAX / GLYCOLAX) packet Take 17 g by mouth 2 (two) times daily as needed.  14 each    . Tamsulosin HCl (FLOMAX) 0.4 MG CAPS Take 1  capsule (0.4 mg total) by mouth daily after supper.  30 capsule     No current facility-administered medications for this visit.   Facility-Administered Medications Ordered in Other Visits  Medication Dose Route Frequency Provider Last Rate Last Dose  . DISCONTD: acetaminophen (TYLENOL) suppository 650 mg  650 mg Rectal Q4H PRN Andrena Mews, DO      . DISCONTD: acetaminophen (TYLENOL) tablet 650 mg  650 mg Oral Q4H PRN Andrena Mews, DO   650 mg at 08/07/11 0149  . DISCONTD: bismuth subsalicylate (PEPTO BISMOL) chewable tablet 524 mg  524 mg Oral PRN Andrena Mews, DO      . DISCONTD: clotrimazole-betamethasone (LOTRISONE) cream   Topical BID Tobin Chad, MD      . DISCONTD: ezetimibe (ZETIA) tablet 10 mg  10 mg Oral Daily Shelly Rubenstein, MD   10 mg at 08/11/11 1259  . DISCONTD: feeding supplement (ENSURE CLINICAL STRENGTH) liquid 237 mL  237 mL Oral TID WC Hettie Holstein, RD   237 mL at 08/11/11 1111  . DISCONTD: fluticasone (FLONASE) 50 MCG/ACT nasal spray 1 spray  1 spray Each Nare Daily Andrena Mews, DO   1 spray at 08/11/11 1000  . DISCONTD: hydroxychloroquine (PLAQUENIL) tablet 400 mg  400 mg Oral Daily Andrena Mews, DO   400 mg at 08/11/11 1111  . DISCONTD: labetalol (NORMODYNE,TRANDATE) injection 10-40 mg  10-40 mg Intravenous Q10 min PRN Andrena Mews, DO      . DISCONTD: methotrexate (RHEUMATREX) tablet 5 mg  5 mg Oral Custom Andrena Mews, DO   5 mg at 08/11/11 1111  .  DISCONTD: metoprolol tartrate (LOPRESSOR) tablet 12.5 mg  12.5 mg Oral BID Elsie [place], MD   12.5 mg at 08/10/11 2259  . DISCONTD: ondansetron (ZOFRAN) injection 4 mg  4 mg Intravenous Q6H PRN Andrena Mews, DO      . DISCONTD: pantoprazole (PROTONIX) EC tablet 40 mg  40 mg Oral Q1200 Nestor Ramp, MD   40 mg at 08/11/11 1258  . DISCONTD: PHENobarbital (LUMINAL) tablet 64.8 mg  64.8 mg Oral QHS Andrena Mews, DO   64.8 mg at 08/08/11 2153  . DISCONTD: PHENobarbital (LUMINAL) tablet 64.8 mg  64.8 mg Oral QHS Nestor Ramp, MD   64.8 mg at 08/10/11 2308  . DISCONTD: polyethylene glycol (MIRALAX / GLYCOLAX) packet 17 g  17 g Oral BID PRN Andrena Mews, DO      . DISCONTD: senna-docusate (Senokot-S) tablet 1 tablet  1 tablet Oral BID Andrena Mews, DO   1 tablet at 08/11/11 1126  . DISCONTD: Tamsulosin HCl (FLOMAX) capsule 0.4 mg  0.4 mg Oral QPC supper Andrena Mews, DO   0.4 mg at 08/10/11 1849   Review Of Systems: Per HPI with the following additions: none Otherwise 12 point review of systems was performed and was unremarkable.  Physical Exam: Filed Vitals:   08/12/11 1124  BP: 122/73  Pulse: 80  Temp: 97.3 F (36.3 C)  Resp: 20  SpO2: 97%  GENERAL: Adult Caucasian male, no distress, comfortable. Atrophied muscles globally.  PSYCH: Alert and appropriately interactive oriented X4; thought content appropriate  HNEENT:  H&N: supple and trachea midline; well healed craniotomy scar over R temporal/frontal area  OROPHARYNX: lips, mucosa, and tongue normal; teeth and gums normal  EYES: conjunctivae/corneas clear. PERRL, EOM's intact. Fundi benign.  NOSE: normal  THORAX:  HEART: RRR, S1/S2 heard, no murmur  LUNGS: CTA B, no wheezes, no crackles  ABDOMEN: +BS soft non-tender  EXTREMITIES: No edema; contracture of L achilles  NEURO: CN II - XII intact, lateralization of L side; diffusely weakened  Labs and Imaging: Lab Results  Component Value Date/Time   NA 139 08/10/2011  6:27  AM   K 4.2 08/10/2011  6:27 AM   CL 103 08/10/2011  6:27 AM   CO2 29 08/10/2011  6:27 AM   BUN 12 08/10/2011  6:27 AM   CREATININE 0.23* 08/10/2011  6:27 AM   CREATININE 0.32* 07/30/2010 11:18 AM   GLUCOSE 86 08/10/2011  6:27 AM   Lab Results  Component Value Date   WBC 4.5 08/10/2011   HGB 12.3* 08/10/2011   HCT 36.8* 08/10/2011   MCV 95.8 08/10/2011   PLT 178 08/10/2011   Assessment and Plan: [name] [name] is a 61 y.o. year old male presenting with protein calorie malnutrition, s/p fall with subdural hematoma, decreased functional ability to care for self at home.   FEN/GI: regular diet plus ensure, PT and ambulation daily.  Disposition: pending weight gain, ADL, safety.    Nursing Home Admission Checklist:  Is pt in EPIC?yes (if not, bring insurance info to [facility] for admin team to add pt to enter into system) Medications reviewed?yes Pt room number entered into nursing home section?yes Have advanced directives been addressed?no Does DNR need to be signed?yes Does pertinent family need to be contacted?no Calculate functional assessment (?PT) if pt is planned on going back home. Is pt a longterm nursing home candidate? possibly If pt short term rehab, obtain PCP info. Are vital signs entered?yes Past History Reviewed?yes Route note to Dr. Sheffield Slider. Bill as Bynum Bellows Pertinent info entered into [employer] List?yes

## 2011-08-13 ENCOUNTER — Non-Acute Institutional Stay: Payer: Self-pay | Admitting: Family Medicine

## 2011-08-13 DIAGNOSIS — S065X9A Traumatic subdural hemorrhage with loss of consciousness of unspecified duration, initial encounter: Secondary | ICD-10-CM

## 2011-08-13 DIAGNOSIS — R531 Weakness: Secondary | ICD-10-CM

## 2011-08-13 DIAGNOSIS — E46 Unspecified protein-calorie malnutrition: Secondary | ICD-10-CM

## 2011-08-13 MED ORDER — PHENOBARBITAL 64.8 MG PO TABS: 64.8000 mg | ORAL_TABLET | Freq: Every day | ORAL | Status: DC

## 2011-08-18 ENCOUNTER — Encounter: Payer: Self-pay | Admitting: Pharmacist

## 2011-08-18 DIAGNOSIS — IMO0002 Reserved for concepts with insufficient information to code with codable children: Secondary | ICD-10-CM

## 2011-08-25 ENCOUNTER — Encounter: Payer: Self-pay | Admitting: Pharmacist

## 2011-08-28 ENCOUNTER — Encounter: Payer: Self-pay | Admitting: Family Medicine

## 2011-08-28 DIAGNOSIS — R531 Weakness: Secondary | ICD-10-CM | POA: Insufficient documentation

## 2011-08-28 DIAGNOSIS — E46 Unspecified protein-calorie malnutrition: Secondary | ICD-10-CM | POA: Insufficient documentation

## 2011-08-28 NOTE — Assessment & Plan Note (Signed)
Multifactorial, requiring nursing home rehab

## 2011-08-28 NOTE — Assessment & Plan Note (Signed)
No symptoms of progression

## 2011-08-28 NOTE — Progress Notes (Signed)
  Subjective:    Patient ID: [name] [name], male    DOB: [birth year], 61 y.o.   MRN: [id number]  HPI No problems emptying his bladder   Review of Systems     Objective:   Physical Exam        Assessment & Plan:  I interviewed and examined this patient and discussed the treatment plan with Dr Rivka Safer.

## 2011-09-03 ENCOUNTER — Encounter: Payer: Self-pay | Admitting: Pharmacist

## 2011-09-09 ENCOUNTER — Non-Acute Institutional Stay: Payer: Self-pay | Admitting: Family Medicine

## 2011-09-09 DIAGNOSIS — IMO0002 Reserved for concepts with insufficient information to code with codable children: Secondary | ICD-10-CM

## 2011-09-09 MED ORDER — METHOTREXATE 2.5 MG PO TABS: 12.5000 mg | ORAL_TABLET | ORAL | Status: DC

## 2011-09-09 MED ORDER — METOPROLOL TARTRATE 12.5 MG HALF TABLET: 12.5000 mg | ORAL_TABLET | Freq: Two times a day (BID) | ORAL | Status: DC

## 2011-09-09 MED ORDER — POLYETHYLENE GLYCOL 3350 17 G PO PACK: 17.0000 g | PACK | Freq: Two times a day (BID) | ORAL | Status: DC | PRN

## 2011-09-09 MED ORDER — EZETIMIBE 10 MG PO TABS: 10.0000 mg | ORAL_TABLET | Freq: Every day | ORAL | Status: DC

## 2011-09-09 MED ORDER — MOMETASONE FUROATE 50 MCG/ACT NA SUSP: 2.0000 | Freq: Every day | NASAL | Status: DC

## 2011-09-09 MED ORDER — PHENOBARBITAL 64.8 MG PO TABS: 64.8000 mg | ORAL_TABLET | Freq: Every day | ORAL | Status: DC

## 2011-09-09 MED ORDER — ERGOCALCIFEROL 1.25 MG (50000 UT) PO CAPS: 50000.0000 [IU] | ORAL_CAPSULE | ORAL | Status: DC

## 2011-09-09 MED ORDER — HYDROXYCHLOROQUINE SULFATE 200 MG PO TABS: 200.0000 mg | ORAL_TABLET | Freq: Two times a day (BID) | ORAL | Status: DC

## 2011-09-09 MED ORDER — DICLOFENAC SODIUM 1 % TD GEL: 1.0000 "application " | Freq: Two times a day (BID) | TRANSDERMAL | Status: DC | PRN

## 2011-09-09 MED ORDER — TAMSULOSIN HCL 0.4 MG PO CAPS: 0.4000 mg | ORAL_CAPSULE | Freq: Every day | ORAL | Status: DC

## 2011-09-09 NOTE — Progress Notes (Signed)
Patient discharged from SNF. Medications re-sent to pharmacy.

## 2011-09-11 ENCOUNTER — Non-Acute Institutional Stay: Payer: Self-pay | Admitting: Family Medicine

## 2011-09-11 NOTE — Progress Notes (Signed)
Pt d/c to home on 4/16

## 2011-09-15 ENCOUNTER — Telehealth: Payer: Self-pay | Admitting: Family Medicine

## 2011-09-15 NOTE — Telephone Encounter (Signed)
Called and gave verbal order for [facility].  Marland KitchenLorenda Hatchet, Renato Battles

## 2011-09-15 NOTE — Telephone Encounter (Signed)
Requesting Home Health MSW order to help with [facility].  A verbal order will be fine.

## 2011-11-21 ENCOUNTER — Encounter: Payer: Self-pay | Admitting: Family Medicine

## 2011-11-21 ENCOUNTER — Ambulatory Visit (INDEPENDENT_AMBULATORY_CARE_PROVIDER_SITE_OTHER): Payer: Medicare Other | Admitting: Family Medicine

## 2011-11-21 VITALS — BP 117/81 | HR 98

## 2011-11-21 DIAGNOSIS — M3320 Polymyositis, organ involvement unspecified: Secondary | ICD-10-CM

## 2011-11-21 DIAGNOSIS — M069 Rheumatoid arthritis, unspecified: Secondary | ICD-10-CM

## 2011-11-21 MED ORDER — FOLIC ACID 1 MG PO TABS: 1.0000 mg | ORAL_TABLET | Freq: Every day | ORAL | Status: DC

## 2011-11-21 NOTE — Assessment & Plan Note (Signed)
Continuing MTX and Plaquenil as ordered by Dr. Kellie Simmering.  I am giving his Rx for folate, as he is not taking this.  Wonder if his dermatitis and erythema may be related to MTX.  For evaluation by Rheumatology.

## 2011-11-21 NOTE — Assessment & Plan Note (Signed)
Worsening muscle wasting.  Encouraged to see Dr. Kellie Simmering for follow up.  Most recent labs done in [facility] system in March, 2013.

## 2011-11-21 NOTE — Patient Instructions (Addendum)
It was a pleasure to see you today.    I am sending a prescription for the folic acid supplement, one daily   I would like for you to see Dr. Kellie Simmering soon for management of the Plaquenil and methotrexate.  I would like to see you back again in another 4 to 6 months, or sooner if needed.

## 2011-11-21 NOTE — Progress Notes (Signed)
  Subjective:    Patient ID: [name] [name], male    DOB: 08/11/1950, 61 y.o.   MRN: [id number]  HPI Seen today in follow up.  Getting around in wheelchair for the most part.  Does not offer complaints.  Says he is not falling, largely because he uses wheelchair.   Continues to take Plaquenil and MTX, prescribed by Dr. Kellie Simmering.  Has not seen Dr Kellie Simmering in awhile (per his report).  Is not taking folate at the present time.   Most recent labs were done in March, reviewed today.   Lives with nephew, mother.   Review of Systems See HPI     Objective:   Physical Exam Alert, no apparent distress. COR S1S2, no extra sounds PULM CLear bilaterally, no rales or crackles.  SKIN: erythema in antecubitals; dryness with flaking across back.  EXTS; marked muscle wasting LEs. No lesions noted on feet upon inspection. Sensation in feet grossly intact.  Long toenails with thickening, yellowing. Orthopedic shoes with excessive wear (especially R shoe).       Assessment & Plan:

## 2012-01-12 ENCOUNTER — Other Ambulatory Visit: Payer: Self-pay | Admitting: *Deleted

## 2012-01-12 MED ORDER — METOPROLOL TARTRATE 12.5 MG HALF TABLET: 12.5000 mg | ORAL_TABLET | Freq: Two times a day (BID) | ORAL | Status: DC

## 2012-01-12 MED ORDER — TAMSULOSIN HCL 0.4 MG PO CAPS: 0.4000 mg | ORAL_CAPSULE | Freq: Every day | ORAL | Status: DC

## 2012-01-12 MED ORDER — EZETIMIBE 10 MG PO TABS: 10.0000 mg | ORAL_TABLET | Freq: Every day | ORAL | Status: DC

## 2012-01-12 MED ORDER — MOMETASONE FUROATE 50 MCG/ACT NA SUSP: 2.0000 | Freq: Every day | NASAL | Status: DC

## 2012-07-23 ENCOUNTER — Other Ambulatory Visit: Payer: Self-pay | Admitting: Family Medicine

## 2012-09-28 ENCOUNTER — Encounter: Payer: Self-pay | Admitting: Family Medicine

## 2012-09-28 ENCOUNTER — Ambulatory Visit (INDEPENDENT_AMBULATORY_CARE_PROVIDER_SITE_OTHER): Payer: Medicare Other | Admitting: Family Medicine

## 2012-09-28 VITALS — BP 122/86 | HR 120 | Ht 66.0 in

## 2012-09-28 DIAGNOSIS — E46 Unspecified protein-calorie malnutrition: Secondary | ICD-10-CM

## 2012-09-28 DIAGNOSIS — M3320 Polymyositis, organ involvement unspecified: Secondary | ICD-10-CM

## 2012-09-28 DIAGNOSIS — M959 Acquired deformity of musculoskeletal system, unspecified: Secondary | ICD-10-CM

## 2012-09-28 DIAGNOSIS — R569 Unspecified convulsions: Secondary | ICD-10-CM

## 2012-09-28 DIAGNOSIS — R1310 Dysphagia, unspecified: Secondary | ICD-10-CM

## 2012-09-28 DIAGNOSIS — E785 Hyperlipidemia, unspecified: Secondary | ICD-10-CM

## 2012-09-28 DIAGNOSIS — M069 Rheumatoid arthritis, unspecified: Secondary | ICD-10-CM

## 2012-09-28 LAB — CBC
HCT: 45.8 % (ref 39.0–52.0)
Hemoglobin: 15.3 g/dL (ref 13.0–17.0)
MCV: 85.4 fL (ref 78.0–100.0)
RDW: 14.5 % (ref 11.5–15.5)
WBC: 3.9 10*3/uL — ABNORMAL LOW (ref 4.0–10.5)

## 2012-09-28 LAB — COMPREHENSIVE METABOLIC PANEL
ALT: 14 U/L (ref 0–53)
AST: 15 U/L (ref 0–37)
CO2: 28 mEq/L (ref 19–32)
Creat: 0.21 mg/dL — ABNORMAL LOW (ref 0.50–1.35)
Total Bilirubin: 0.4 mg/dL (ref 0.3–1.2)

## 2012-09-28 LAB — LIPID PANEL
HDL: 64 mg/dL (ref >39)
LDL Cholesterol: 139 mg/dL — ABNORMAL HIGH (ref 0–99)
Total CHOL/HDL Ratio: 3.5 Ratio

## 2012-09-28 MED ORDER — DESLORATADINE 5 MG PO TABS: 5.0000 mg | ORAL_TABLET | Freq: Every day | ORAL | Status: DC

## 2012-09-28 MED ORDER — PHENOBARBITAL 64.8 MG PO TABS: 64.8000 mg | ORAL_TABLET | Freq: Every day | ORAL | Status: DC

## 2012-09-28 MED ORDER — MOMETASONE FUROATE 50 MCG/ACT NA SUSP: 2.0000 | Freq: Every day | NASAL | Status: DC

## 2012-09-28 NOTE — Assessment & Plan Note (Signed)
Has had continued weight loss, associated weakness.  To try and implement high-cal shakes with meals.  I recommend him to see our RD, Dr. Wyona Almas here in the [facility].

## 2012-09-28 NOTE — Progress Notes (Signed)
  Subjective:    Patient ID: [name] [name], male    DOB: 02-Apr-1951, 62 y.o.   MRN: [id number]  HPI  Here today with caregiver nephew [name] [name]; brought in wheelchair.  They state that [name] [name] has not been able to walk for awhile now.  Had been walking until home health PT stopped.  Previously walked with a cane, but suffered a few falls.  [name] [name] requests a new Rx for "Diabetic shoes" to help with stability for [name] [name] to try to get back to walking.  A paper Rx was written and mailed to patient after this visit.  Reports that he has had feeling "that something stuck in my throat" for the past several weeks.  Not painful, but uncomfortable feeling.  Nephew relates it to onset of spring pollen.  No significant cough; no sputum production.  No foul taste in back of throat.   Has continued to lose weight.  History polymyositis with muscle wasting; also RA and notes the pain in his hands/wrists and knees has been worse.  Has not been on MTX or Plaquenil recently.  Has not seen his rheumatologist in quite awhile (Dr Kellie Simmering had been seeing him regularly in the past).    Discussed colon cancer screening today.  [name] [name] reports having a colonoscopy many years ago on [address], thinks it was with Dr Loreta Ave.  No changes in bowel habits; no constipation or diarrhea, no blood in stool, no melena.  He is recommended to call GI for screening colonoscopy.   Social Hx: Used to smoke and drink, quit both "cold [place]" about 20+ years ago.    Family Hx; Father had TB.  Review of Systems No fevers or chills, no chest pain, no dyspnea or cough.  No HA.  No recent falls.     Objective:   Physical Exam Cachexia; alert and good historian.  In wheelchair.  HEENT: Neck supple. No frontal or maxillary sinus tenderness.  Question of shotty anterior cervical notes.  Nontender thyroid. Clear oropharynx. Dry nasal mucus membranes. TMs clear bilaterally.  COR Regular S1S2 PULM Clear bilaterally.  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 No apparent active synovitis in hands/wrists.  Wasting in thenar/hypothenar areas of both hands. No lower ext edema (wasting).        Assessment & Plan:

## 2012-09-28 NOTE — Assessment & Plan Note (Signed)
Has been off the Plaquenil and MTX for some time; has not been seeing Dr Kellie Simmering recently for his RA.  Will call to establish follow up.

## 2012-09-28 NOTE — Assessment & Plan Note (Signed)
Feeling that food stuck in throat, mild, since onset of spring pollen.  Some nasal congestion.  Trial antihistamines and Nasal steroid spray first.  Distant history of smoking and alcohol (over 20 yrs ago).  Nephew Greig Castilla) is here today, caregiver for [facility]. Will call if not improving with this; may be indication for ENT/laryngoscopic evaluation.

## 2012-09-28 NOTE — Assessment & Plan Note (Signed)
Holding statin because of concern that it might worsen polymyositis.

## 2012-09-28 NOTE — Assessment & Plan Note (Signed)
No sz in quite some time (several years).  Takes phenobarbital; will check med level today.  He has undergone significant muscle wasting which appears to be accelerating.

## 2012-09-28 NOTE — Assessment & Plan Note (Signed)
Rechecking CMEt (includes CPK); he has been taken off statins years ago at the recommendation of his rheumatologist, due to concerns that they might worsen his muscle disease.  Has completed course of home PT.  Requesting DM shoes that help him to walk.  Handwritten Rx for DM shoes, for indications of 710.4, 738.9, and 263.9 mailed to his home (done after the visit).

## 2012-09-28 NOTE — Patient Instructions (Addendum)
It was a pleasure to see you today. We are checking labs today.   I would like you to call and schedule with Dr Kellie Simmering for follow up of your rheumatoid arthritis and polymyositis.  I would like you to see our Registered Dietician Dr. Wyona Almas, here in the [facility], for strategies to help with your weight.  I recommend you call the GI doctor (Dr. Loreta Ave) for scheduling a screening colonoscopy to screen for colon cancer.   I will write a paper prescription for the diabetic shoes.  [phone] home phone to call with labs.

## 2012-09-29 ENCOUNTER — Encounter: Payer: Self-pay | Admitting: Family Medicine

## 2012-09-29 LAB — PHENOBARBITAL LEVEL: Phenobarbital: 14.9 ug/mL — ABNORMAL LOW (ref 15.0–40.0)

## 2012-10-13 ENCOUNTER — Other Ambulatory Visit: Payer: Self-pay | Admitting: Rheumatology

## 2012-10-13 DIAGNOSIS — R1310 Dysphagia, unspecified: Secondary | ICD-10-CM

## 2012-10-20 ENCOUNTER — Ambulatory Visit
Admission: RE | Admit: 2012-10-20 | Discharge: 2012-10-20 | Disposition: A | Discharge status: Home / Self Care | Payer: Medicare Other | Source: Ambulatory Visit | Attending: Rheumatology | Admitting: Rheumatology

## 2012-10-20 DIAGNOSIS — R1310 Dysphagia, unspecified: Secondary | ICD-10-CM

## 2013-07-12 ENCOUNTER — Ambulatory Visit: Payer: Medicare Other | Admitting: Family Medicine

## 2013-07-22 ENCOUNTER — Ambulatory Visit (INDEPENDENT_AMBULATORY_CARE_PROVIDER_SITE_OTHER): Payer: Medicare Other | Admitting: Family Medicine

## 2013-07-22 ENCOUNTER — Encounter: Payer: Self-pay | Admitting: Family Medicine

## 2013-07-22 VITALS — BP 140/80 | HR 80 | Temp 97.7°F | Ht 66.0 in

## 2013-07-22 DIAGNOSIS — R569 Unspecified convulsions: Secondary | ICD-10-CM

## 2013-07-22 DIAGNOSIS — Z23 Encounter for immunization: Secondary | ICD-10-CM

## 2013-07-22 DIAGNOSIS — E559 Vitamin D deficiency, unspecified: Secondary | ICD-10-CM | POA: Insufficient documentation

## 2013-07-22 DIAGNOSIS — R1310 Dysphagia, unspecified: Secondary | ICD-10-CM

## 2013-07-22 DIAGNOSIS — M3320 Polymyositis, organ involvement unspecified: Secondary | ICD-10-CM

## 2013-07-22 DIAGNOSIS — I10 Essential (primary) hypertension: Secondary | ICD-10-CM

## 2013-07-22 DIAGNOSIS — E46 Unspecified protein-calorie malnutrition: Secondary | ICD-10-CM

## 2013-07-22 LAB — COMPREHENSIVE METABOLIC PANEL
ALBUMIN: 3.8 g/dL (ref 3.5–5.2)
ALT: 13 U/L (ref 0–53)
AST: 19 U/L (ref 0–37)
Alkaline Phosphatase: 79 U/L (ref 39–117)
BUN: 10 mg/dL (ref 6–23)
CALCIUM: 9.2 mg/dL (ref 8.4–10.5)
CHLORIDE: 100 meq/L (ref 96–112)
CO2: 32 mEq/L (ref 19–32)
Creat: 0.17 mg/dL — ABNORMAL LOW (ref 0.50–1.35)
GLUCOSE: 95 mg/dL (ref 70–99)
POTASSIUM: 4.4 meq/L (ref 3.5–5.3)
Sodium: 140 mEq/L (ref 135–145)
Total Bilirubin: 0.4 mg/dL (ref 0.2–1.2)
Total Protein: 7.9 g/dL (ref 6.0–8.3)

## 2013-07-22 MED ORDER — PHENOBARBITAL 64.8 MG PO TABS: 64.8000 mg | ORAL_TABLET | Freq: Every day | ORAL | Status: DC

## 2013-07-22 MED ORDER — EZETIMIBE 10 MG PO TABS: 10.0000 mg | ORAL_TABLET | Freq: Every day | ORAL | Status: AC

## 2013-07-22 MED ORDER — DESLORATADINE 5 MG PO TABS: 5.0000 mg | ORAL_TABLET | Freq: Every day | ORAL | Status: DC

## 2013-07-22 MED ORDER — MOMETASONE FUROATE 50 MCG/ACT NA SUSP: 2.0000 | Freq: Every day | NASAL | Status: DC

## 2013-07-22 NOTE — Assessment & Plan Note (Signed)
Rechecking vitamin D level, for repletion as needed. JB

## 2013-07-22 NOTE — Assessment & Plan Note (Signed)
No recent falls; patient is confined to wheelchair.  For home health PT to be ordered, to increase strength and improve mobility somewhat. JB

## 2013-07-22 NOTE — Progress Notes (Signed)
Subjective:     Patient ID: [name] [name], male   DOB: Jan 05, 1951, 63 y.o.   MRN: [id number] Attending Physician Note: Patient seen and examined by me in conjunction with medical student Marlou Porch, MS3-[employer], and I agree with his assessment and plan. My additions in bold type. [name] [name] is here with nephew and primary caregiver for follow up.  He continues to be confined to the wheelchair.  Since our last meeting, his mother [name] [name] died on June 22, 2022.  He is taking this well.   Nephew with questions about possibility of getting home health PT to help improve mobility at home.   Patient has seen Dr Kellie Simmering for his RA and myositis; plans to follow up there in the near future.  JB HPI:  [name] [name] is a 63 year old male with a history of polymyositis, rheumatoid arthritis, hyperlipidemia/HTN, dysphagia, and CVA, that presents for follow-up.  He complains of some L ankle pain that he thinks is related to how he is sleeping and has noticed mild swelling.  He continues to notice significant muscle wasting despite a heavy diet, but there is no other associated pain.  The patient has been wheelchair-bound for about 1.5 years, and would start to try walking again.  Reviewed and agree. JB  His most recent complaint of dysphagia (5/14) has resolved.  He thinks this is related to tensing up when he eats and has worked to stop doing this.  Evaluation of the dysphagia last May found no strictures or other organic causes.Reviewed and agree. JB  The patient is being followed by his rheumatologist for his RA.  He was prescribed "a heavy dose iburpofen" prn (which the patient thinks is Tramadol) and has no joint pain on presentation.  He occasionally gets pain in the MCP joints when writing, but he notes it is very mild.  Reviewed and agree. JB  [name] [name] would like refills on 3 of his medications--Ezetimide, Metoprolol and Phenobarbital.  He has a history of seizures after his CVA, but primarily wants the  Phenobarbital for sleep.  He has been out of the medication for the last 2-3 weeks, and has not been sleeping well during that time.Reviewed and agree. JB  In discussing colon screening, the patient thinks he had a normal colonoscopy about 3 years ago at [facility].     Review of Systems The patient endorses dry skin on extremities and on scalp.  Denies recent falls, significant joint pain, muscle aches and pains, dysphagia, rhinitis/sinus tenderness/congestion/exacerbation of allergies.  Denies chest pain and respiratory symptoms.  All other ROS negative. Prior history of seizures following ruptured aneurysm; none since.  Has been maintained on phenobarbital since that time. 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 No apparent distress. Answers questions appropriately.  HEENT neck supple. COR regular S1S2 PULM Clear bilaterally, no rales or wheezes.  EXTS: Significant muscle wasting in arms and legs. JB    Assessment:     [name] [name] is a 63 year old male with a history of polymyositis, rheumatoid arthritis, hyperlipidemia/HTN, dysphagia, and CVA, that presents for follow-up.  His chronic conditions are under control and he has no  new or worsening symptoms.  However, he has been out of refills and therefore non-compliant with medications.  The most dehabilitating of his conditions is his polymyositis, which has caused significant muscle wasting despite a heavy appetite and has made him wheelchair-bound for the last year and a half.  He desires and should get physical therapy to assess the  possibility of ambulating again.  His diet needs to be optimized for protein nutrition and proper weight gain.       Plan:     1. Polymyositis: *PT referral to assess ability and possibly start ambulation *Referall to registered dietician to address protein malnutrition and muscle loss See Problem List. JB 2. Seizure Prophylaxis and Sleep *Check phenobarbital levels  *Refill phenobarbital, to be taken at night   3. Hyperlipidemia *Refill and resume taking Ezetimide Not a candidate for statins due to polymyositis. JB  4. HTN *Refill and resume taking Metoprolol  Has not been taking BB in quite some time; blood pressure not requiring medical therapy at this time. Med discontinued. JB    5. Rheumatoid Arthritis *Follow up with Dr. Zelphia Cairo for pain management Nephew believes patient had been prescribed Tramadol by Dr Kellie Simmering in the past. Not confirmed. JB  6.General health maintenance  *Check Vit D level Checking vitamin D level.  Believes his last colonoscopy (screening) was around 3 years ago, possibly at [facility]. Will look for records to confirm this. Counseling about colon cancer screening with colonoscopy every 10 years if prior study negative. JB

## 2013-07-22 NOTE — Assessment & Plan Note (Signed)
Patient with good appetite.  Has had significant muscle wasting. Nephew has many questions about the best diet to improve strength and slow declining muscle mass. Referred to RD for visit to discuss this.  JB

## 2013-07-22 NOTE — Assessment & Plan Note (Signed)
BP well controlled off the BB. WIll hold metoprolol at this time.

## 2013-07-22 NOTE — Patient Instructions (Signed)
It was a pleasure to see you today.  As we discussed, we are checking labs today and refilling your medications in 90-day increments.   Flu vaccine today.   If your vitamin D level is low, we will call at [phone] to discuss repletion of vitamin D.  I ordered Physical Therapy, they will contact you to establish service.  I recommend a visit with our Registered Dietician, Dr. Gerilyn Pilgrim, in [facility] to address dietary approach to your muscle loss and protein malnutrition.  [employer], please schedule with Wyona Almas.)  Follow up with Dr Mauricio Po in 3 months.

## 2013-07-23 LAB — VITAMIN D 25 HYDROXY (VIT D DEFICIENCY, FRACTURES): VIT D 25 HYDROXY: 12 ng/mL — AB (ref 30–89)

## 2013-07-23 LAB — PHENOBARBITAL LEVEL: Phenobarbital: <0.4 ug/mL — ABNORMAL LOW (ref 15.0–40.0)

## 2013-08-24 ENCOUNTER — Telehealth: Payer: Self-pay | Admitting: Family Medicine

## 2013-08-24 ENCOUNTER — Encounter: Payer: Self-pay | Admitting: Family Medicine

## 2013-08-24 DIAGNOSIS — IMO0002 Reserved for concepts with insufficient information to code with codable children: Secondary | ICD-10-CM

## 2013-08-24 MED ORDER — ERGOCALCIFEROL 1.25 MG (50000 UT) PO CAPS: 50000.0000 [IU] | ORAL_CAPSULE | ORAL | Status: DC

## 2013-08-24 NOTE — Telephone Encounter (Signed)
Attempted to call patient to report labs, including low vitamin D level and need for repletion.  No answer or answering machine. Will send letter informing of this, as well as Rx for Vit D 50,000 units weekly for 6 to 8 weeks and then recheck.

## 2013-11-08 ENCOUNTER — Ambulatory Visit (INDEPENDENT_AMBULATORY_CARE_PROVIDER_SITE_OTHER): Payer: Medicare Other | Admitting: Family Medicine

## 2013-11-08 ENCOUNTER — Encounter: Payer: Self-pay | Admitting: Family Medicine

## 2013-11-08 VITALS — BP 136/86 | HR 108 | Ht 66.0 in

## 2013-11-08 DIAGNOSIS — M3320 Polymyositis, organ involvement unspecified: Secondary | ICD-10-CM

## 2013-11-08 DIAGNOSIS — M069 Rheumatoid arthritis, unspecified: Secondary | ICD-10-CM

## 2013-11-08 DIAGNOSIS — E559 Vitamin D deficiency, unspecified: Secondary | ICD-10-CM

## 2013-11-08 DIAGNOSIS — E46 Unspecified protein-calorie malnutrition: Secondary | ICD-10-CM

## 2013-11-08 NOTE — Patient Instructions (Signed)
It was a pleasure to see you today.  We will check the Vitamin D level today and I will call [name] [phone]) with the results.   Make appointment with Dr. Gerilyn Pilgrim, dietician, before leaving today.   Dr. Ines Bloomer number is [phone].  We will arrange for the physical/occupational therapists to come do a home evaluation.   I would like to see [name] [name] back in another 3 months.   PLEASE GIVE PATIENT APPT WITH DR Gerilyn Pilgrim OR INSTRUCTIONS ON HOW TO DO SO.

## 2013-11-09 ENCOUNTER — Telehealth: Payer: Self-pay | Admitting: Family Medicine

## 2013-11-09 ENCOUNTER — Encounter: Payer: Self-pay | Admitting: Family Medicine

## 2013-11-09 ENCOUNTER — Telehealth: Payer: Self-pay

## 2013-11-09 DIAGNOSIS — M3320 Polymyositis, organ involvement unspecified: Secondary | ICD-10-CM

## 2013-11-09 LAB — VITAMIN D 25 HYDROXY (VIT D DEFICIENCY, FRACTURES): VIT D 25 HYDROXY: 35 ng/mL (ref 30–89)

## 2013-11-09 NOTE — Progress Notes (Signed)
   Subjective:    Patient ID: [name] [name], male    DOB: [birth year], 63 y.o.   MRN: [id number]  HPI Patient here with nephew [name] (cell [phone]) for follow up. Has been doing well at home; [name] expresses some sadness when he thinks about his mother, who died on 09-Feb-2024of this year. Doesn't think he's 'depressed' but does get wistful.  Carlitos is wheelchair bound.  PT did not come to house since last visit.  [name] believes [name] would benefit from a hospital bed.  No falls.   Has not been back to Dr Kellie Simmering since last visit with me.   Patient takes phenobarbital "for sleep" (no seizure disorder), 'when I need it'.    We discussed low vitamin D level and recent high-dose repletion; for check of Vitamin D level again today and plan for repletion depending on today's level.    Review of Systems     Objective:   Physical Exam  Well appearing, no apparent distress. Wheelchair bound.  HEENT Neck supple. 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 Mottled appearance of skin over both hands. Sensation grossly intact bilateral hands.  Profound muscle wasting of legs bilaterally.       Assessment & Plan:

## 2013-11-09 NOTE — Telephone Encounter (Signed)
Dr. Mauricio Po please put in a referral order for Home Health PT/OT. Thanks, [employer]

## 2013-11-09 NOTE — Telephone Encounter (Signed)
Tried to call nephew [name] regarding Vitamin D level which has responded to repletion.  No voice mailbox set up; will send letter. JB

## 2013-11-09 NOTE — Assessment & Plan Note (Signed)
Hand findings consistent with rheumatoid arthritis.  [name] [name] plans to make appointment with patient's rheumatologist, Dr Kellie Simmering. Given office number for him to call and arrange.

## 2013-11-09 NOTE — Assessment & Plan Note (Signed)
Progressive, now wheelchair-bound. No recent falls. Cared for in home by nephew [name].  HH PT services ordered at last visit, family says not performed.  To look into what we can do to secure a HHPT/OT evaluation for [facility].  Assessment for hospital bed at home as well.

## 2013-11-10 ENCOUNTER — Telehealth: Payer: Self-pay | Admitting: Clinical

## 2013-11-10 DIAGNOSIS — M3320 Polymyositis, organ involvement unspecified: Secondary | ICD-10-CM

## 2013-11-10 NOTE — Addendum Note (Signed)
Addended by: Barbaraann Barthel on: 11/10/2013 10:05 AM   Modules accepted: Orders

## 2013-11-10 NOTE — Telephone Encounter (Signed)
[name] [name] with [facility] informed of new order for a hospital bed.  Theresia Bough, MSW, LCSW [phone]

## 2013-11-10 NOTE — Telephone Encounter (Signed)
CSW contacted pt nephew to inform him that the order for PT/OT have been sent to [place]. Pt nephew very appreciative and is also requesting a hospital bed for pt. CSW informed nephew that CSW would fwd request to PCP.  Theresia Bough, MSW, LCSW [phone]

## 2013-11-10 NOTE — Telephone Encounter (Signed)
Order placed for hospital bed.    Paula Compton, MD

## 2013-11-15 ENCOUNTER — Telehealth: Payer: Self-pay | Admitting: Family Medicine

## 2013-11-15 NOTE — Telephone Encounter (Signed)
I reordered the HHPT/OT referral.  JB

## 2013-11-15 NOTE — Telephone Encounter (Signed)
FYI to PCP

## 2013-11-15 NOTE — Telephone Encounter (Signed)
[facility] will process referral and they will call patient with appointment information.Busick, Rodena Medin

## 2013-11-15 NOTE — Telephone Encounter (Signed)
[facility] called and they did the evaluation for PT on [name] [name] and said that he is not a candidate at this time. jw

## 2013-11-16 ENCOUNTER — Telehealth: Payer: Self-pay | Admitting: Clinical

## 2013-11-16 NOTE — Telephone Encounter (Signed)
CSW contacted [name] [name] with [name] [name] to inquire about pt not qualifying for PT. 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 CSW also informed that the therapists did perform exercises with pt however any movement was too painful for pt.   [name] [name], MSW, LCSW [phone]

## 2014-03-21 ENCOUNTER — Other Ambulatory Visit: Payer: Self-pay | Admitting: Family Medicine

## 2014-04-27 ENCOUNTER — Other Ambulatory Visit: Payer: Self-pay | Admitting: Family Medicine

## 2014-07-03 ENCOUNTER — Other Ambulatory Visit: Payer: Self-pay | Admitting: Family Medicine

## 2014-07-09 ENCOUNTER — Other Ambulatory Visit: Payer: Self-pay | Admitting: Family Medicine

## 2015-03-07 IMAGING — RF DG ESOPHAGUS
19 series · 19 of 19 positions shown · non-contrast
Comparison: None.

CLINICAL DATA: Dysphagia.

ESOPHAGUS/BARIUM SWALLOW/TABLET STUDY
Fluoroscopy Time: 1 minute, 24 seconds.

[Series 1: run · 1 of 1 slices shown (1 of 19)]
[im 1/1]
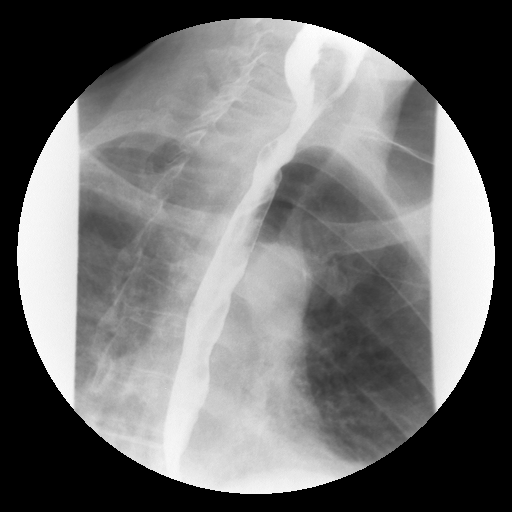

[Series 2: run · 1 of 1 slices shown (2 of 19)]
[im 1/1]
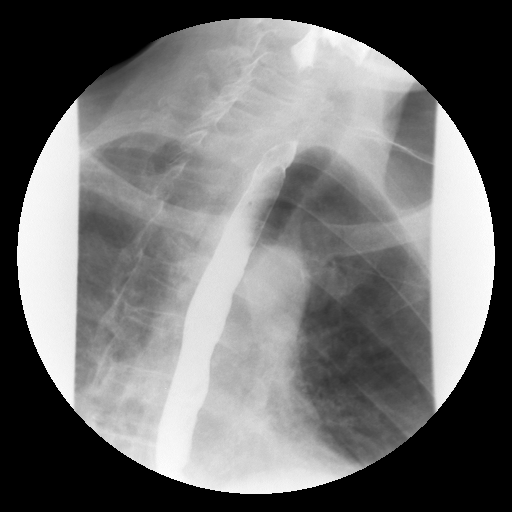

[Series 3: run · 1 of 1 slices shown (3 of 19)]
[im 1/1]
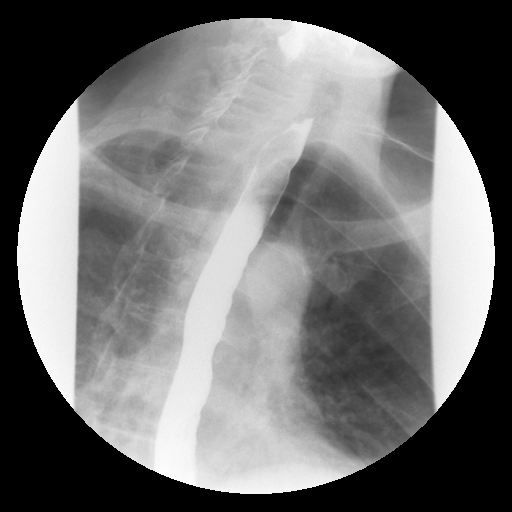

[Series 4: run · 1 of 1 slices shown (4 of 19)]
[im 1/1]
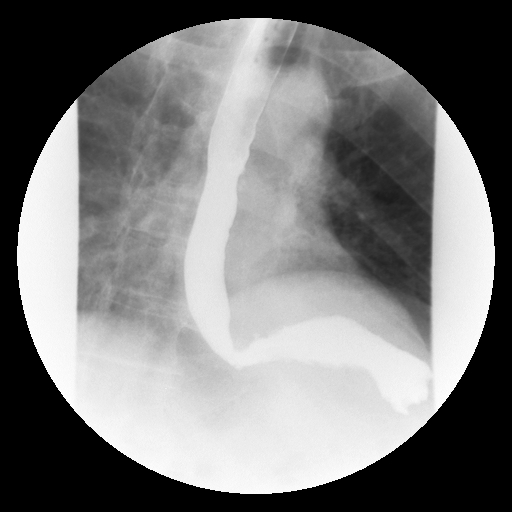

[Series 5: run · 1 of 1 slices shown (5 of 19)]
[im 1/1]
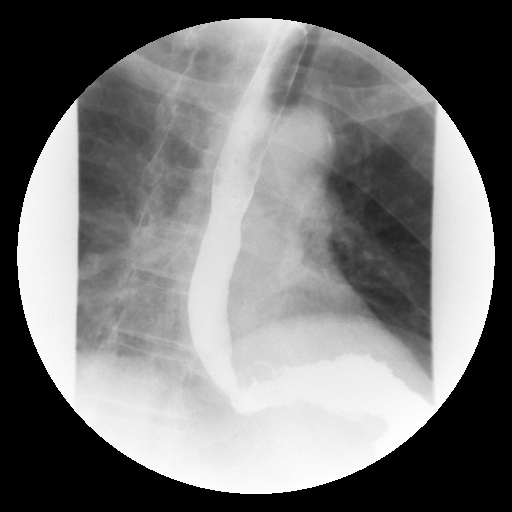

[Series 6: run · 1 of 1 slices shown (6 of 19)]
[im 1/1]
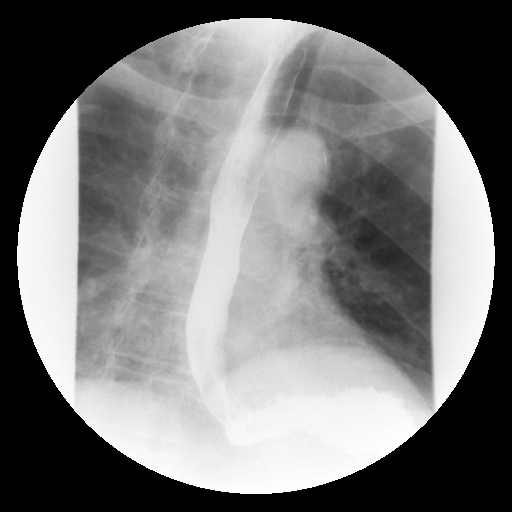

[Series 7: run · 1 of 1 slices shown (7 of 19)]
[im 1/1]
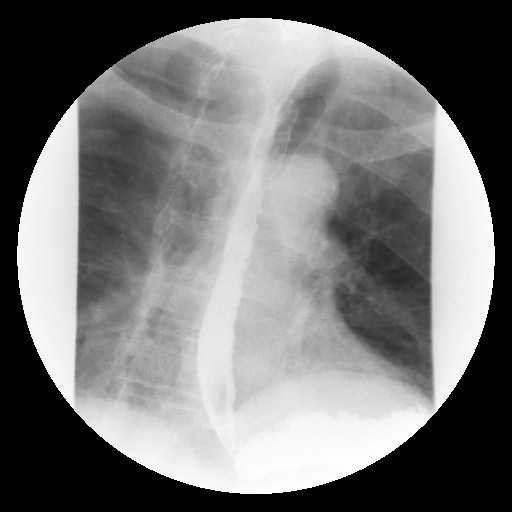

[Series 8: run · 1 of 1 slices shown (8 of 19)]
[im 1/1]
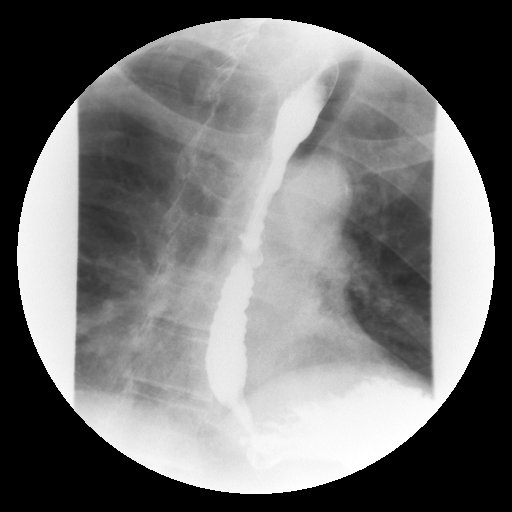

[Series 9: run · 1 of 1 slices shown (9 of 19)]
[im 1/1]
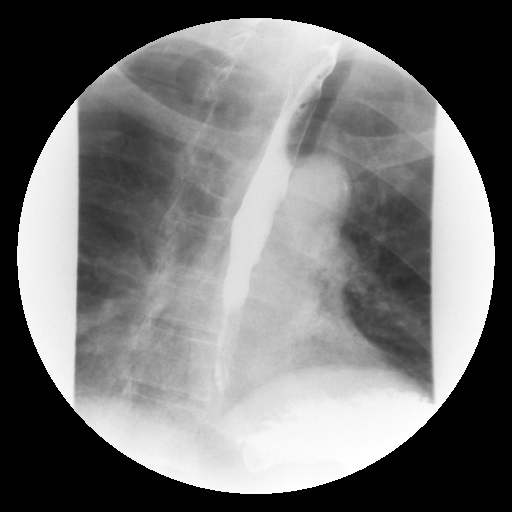

[Series 10: run · 1 of 1 slices shown (10 of 19)]
[im 1/1]
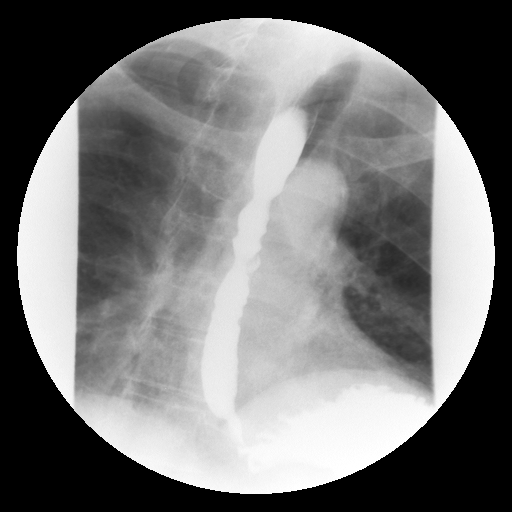

[Series 11: run · 1 of 1 slices shown (11 of 19)]
[im 1/1]
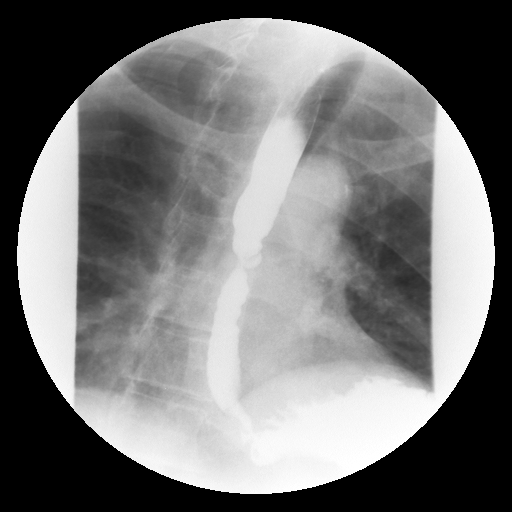

[Series 12: run · 1 of 1 slices shown (12 of 19)]
[im 1/1]
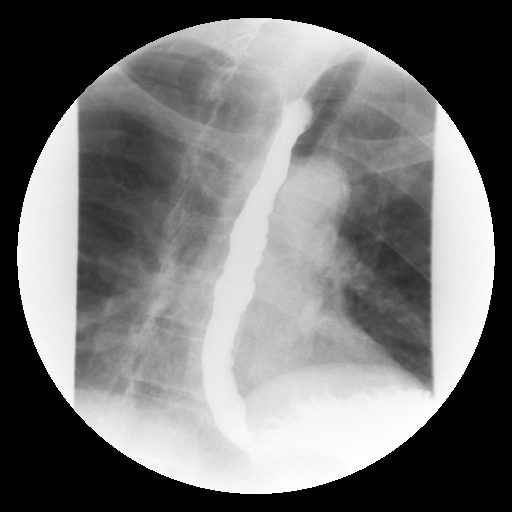

[Series 13: run · 1 of 1 slices shown (13 of 19)]
[im 1/1]
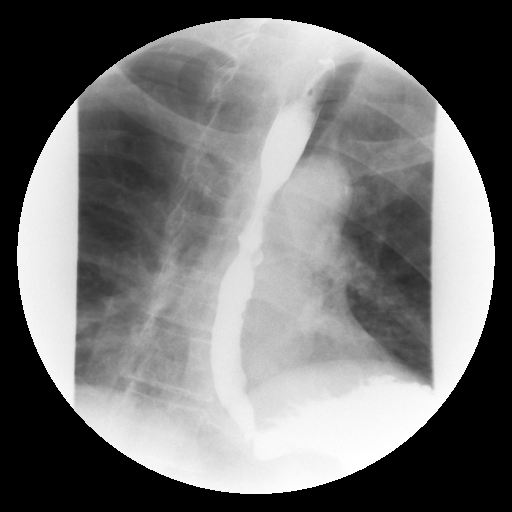

[Series 14: run · 1 of 1 slices shown (14 of 19)]
[im 1/1]
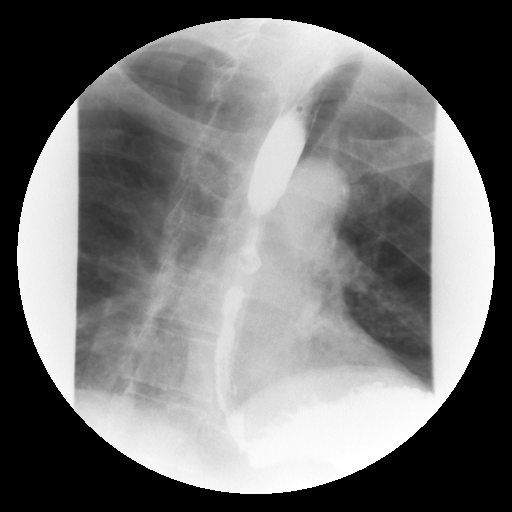

[Series 15: run · 1 of 1 slices shown (15 of 19)]
[im 1/1]
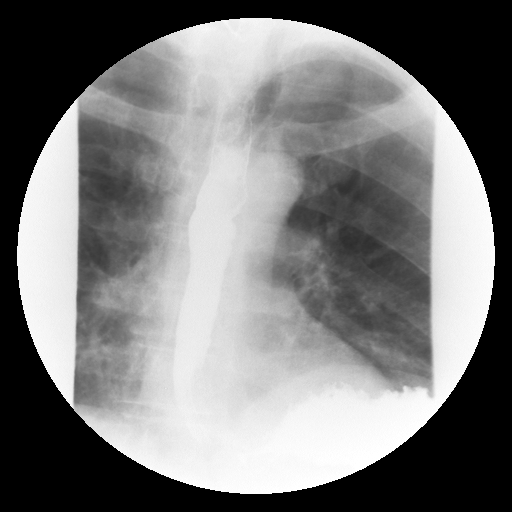

[Series 16: run · 1 of 1 slices shown (16 of 19)]
[im 1/1]
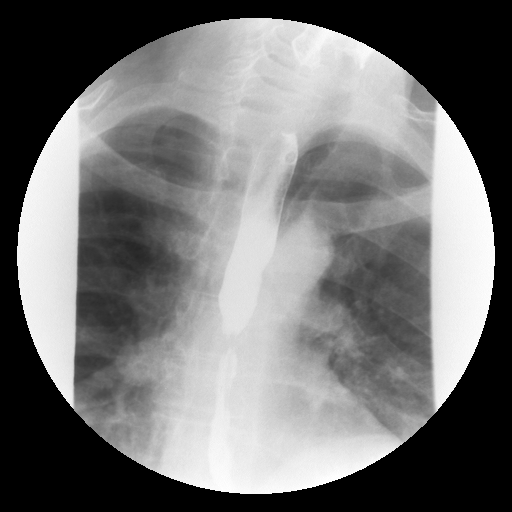

[Series 17: run · 1 of 1 slices shown (17 of 19)]
[im 1/1]
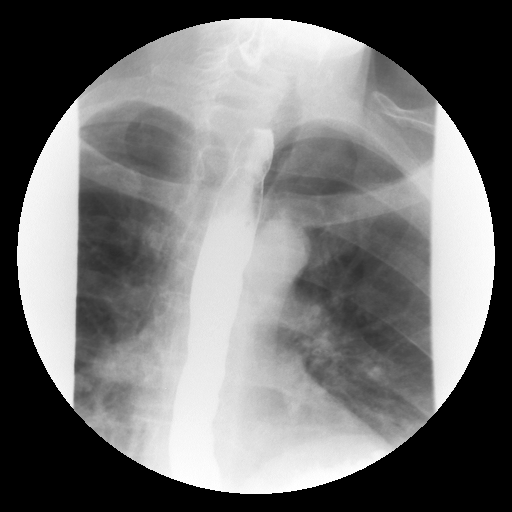

[Series 18: run · 1 of 1 slices shown (18 of 19)]
[im 1/1]
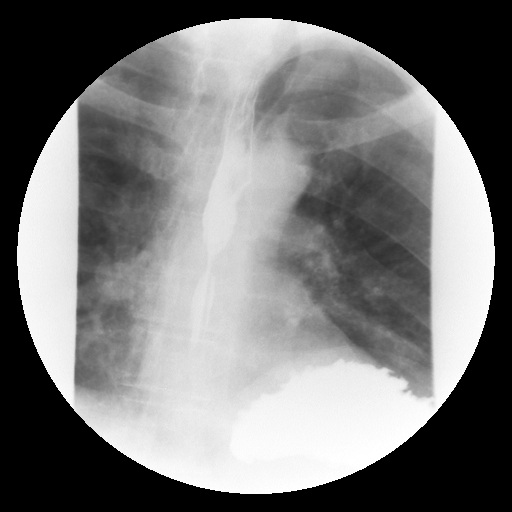

[Series 19: run · 1 of 1 slices shown (19 of 19)]
[im 1/1]
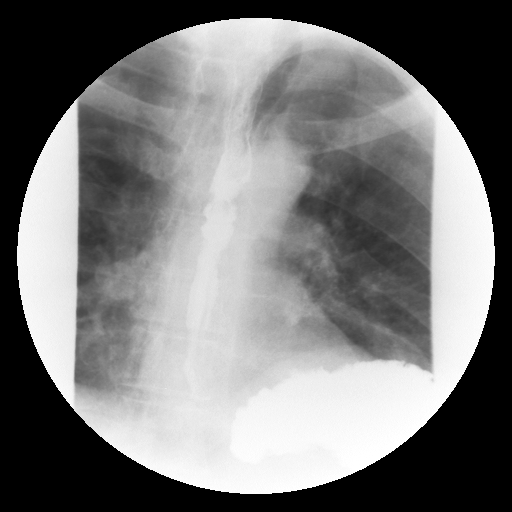

[19 of 19 positions shown; findings below may reference images not displayed]

FINDINGS: Due to the patient's condition, the entire study was
performed with the patient lying down.  There is disruption of
primary esophageal peristaltic waves.  Moderate tertiary
contractions throughout the esophagus.  No fixed stricture, fold
thickening or mass.  There is a small diverticulum within the mid
portion of the esophagus.  No reflux noted.
IMPRESSION: Esophageal dysmotility.  Moderate tertiary contractions/spasm.

Small mid esophageal diverticulum.

## 2015-05-24 ENCOUNTER — Encounter: Payer: Self-pay | Admitting: Family Medicine

## 2015-05-24 NOTE — Progress Notes (Signed)
Patient ID: [name] [name], male   DOB: [birth year], 64 y.o.   MRN: [id number] I got paged by the paramedic Alice Reichert). He stated patient was found dead at home by his nephew. No foul play suspected. He is requesting a physician to complete his death certificate. I have not personally taken care of this patient. He was a patient of Dr. Mauricio Po but his DC can be completed based on his record on epic by Dr. Mauricio Po.

## 2015-05-27 DEATH — deceased

## 2015-05-29 ENCOUNTER — Encounter: Payer: Self-pay | Admitting: Family Medicine

## 2015-05-29 NOTE — Progress Notes (Signed)
Patient ID: [name] [name], male   DOB: [birth year], 65 y.o.   MRN: [id number] I received death certificate for patient to be completed by me. Form was completed based on Dr. Marinell Blight documentations for patient since I have not taken care of him. This was completed to the best of my knowledge based on documentation on his medical record and the report given by the paramedics during last conversation about the circumstances surrounding his death.  He has hx of polymyositis which can be complicated by aspiration PNA and if not promptly treated can lead to death. He also has hx of dysphagia which could be due to progressively worsening polymyositis which can also cause aspiration PNA.
